# Patient Record
Sex: Female | Born: 1960 | Race: Black or African American | Hispanic: No | Marital: Married | Smoking: Never smoker
Health system: Southern US, Community
[De-identification: ages and names within clinical notes are randomized; demographics above are authoritative.]

## PROBLEM LIST (undated history)

## (undated) DIAGNOSIS — Z9889 Other specified postprocedural states: Secondary | ICD-10-CM

## (undated) DIAGNOSIS — D219 Benign neoplasm of connective and other soft tissue, unspecified: Secondary | ICD-10-CM

## (undated) DIAGNOSIS — D649 Anemia, unspecified: Secondary | ICD-10-CM

## (undated) DIAGNOSIS — C801 Malignant (primary) neoplasm, unspecified: Secondary | ICD-10-CM

## (undated) DIAGNOSIS — R112 Nausea with vomiting, unspecified: Secondary | ICD-10-CM

## (undated) HISTORY — PX: UTERINE FIBROID EMBOLIZATION: SHX825

## (undated) HISTORY — PX: MYOMECTOMY VAGINAL APPROACH: SUR871

## (undated) HISTORY — DX: Anemia, unspecified: D64.9

## (undated) HISTORY — DX: Malignant (primary) neoplasm, unspecified: C80.1

---

## 1997-10-14 DIAGNOSIS — D219 Benign neoplasm of connective and other soft tissue, unspecified: Secondary | ICD-10-CM

## 1997-10-14 HISTORY — DX: Benign neoplasm of connective and other soft tissue, unspecified: D21.9

## 2003-08-18 ENCOUNTER — Other Ambulatory Visit: Admission: RE | Admit: 2003-08-18 | Discharge: 2003-08-18 | Payer: Self-pay | Admitting: Obstetrics and Gynecology

## 2004-12-18 ENCOUNTER — Other Ambulatory Visit: Admission: RE | Admit: 2004-12-18 | Discharge: 2004-12-18 | Payer: Self-pay | Admitting: Obstetrics and Gynecology

## 2006-01-20 ENCOUNTER — Other Ambulatory Visit: Admission: RE | Admit: 2006-01-20 | Discharge: 2006-01-20 | Payer: Self-pay | Admitting: Obstetrics and Gynecology

## 2008-09-27 ENCOUNTER — Encounter: Admission: RE | Admit: 2008-09-27 | Discharge: 2008-10-12 | Payer: Self-pay | Admitting: Family Medicine

## 2008-10-10 ENCOUNTER — Encounter: Admission: RE | Admit: 2008-10-10 | Discharge: 2008-10-10 | Payer: Self-pay | Admitting: Family Medicine

## 2008-10-17 ENCOUNTER — Encounter: Admission: RE | Admit: 2008-10-17 | Discharge: 2008-11-03 | Payer: Self-pay | Admitting: Family Medicine

## 2010-09-18 ENCOUNTER — Encounter: Admission: RE | Admit: 2010-09-18 | Discharge: 2010-09-18 | Payer: Self-pay | Admitting: Obstetrics and Gynecology

## 2010-11-04 ENCOUNTER — Encounter: Payer: Self-pay | Admitting: Obstetrics and Gynecology

## 2010-11-11 ENCOUNTER — Encounter
Admission: RE | Admit: 2010-11-11 | Discharge: 2010-11-11 | Payer: Self-pay | Source: Home / Self Care | Attending: Obstetrics and Gynecology | Admitting: Obstetrics and Gynecology

## 2010-11-12 ENCOUNTER — Other Ambulatory Visit (HOSPITAL_COMMUNITY): Payer: Self-pay | Admitting: Interventional Radiology

## 2010-11-12 DIAGNOSIS — D219 Benign neoplasm of connective and other soft tissue, unspecified: Secondary | ICD-10-CM

## 2011-01-07 ENCOUNTER — Other Ambulatory Visit (HOSPITAL_COMMUNITY): Admit: 2011-01-07 | Payer: Self-pay | Admitting: Interventional Radiology

## 2011-01-07 ENCOUNTER — Ambulatory Visit (HOSPITAL_COMMUNITY): Payer: BC Managed Care – PPO

## 2011-01-07 ENCOUNTER — Ambulatory Visit (HOSPITAL_COMMUNITY): Admission: RE | Admit: 2011-01-07 | Payer: Self-pay | Source: Ambulatory Visit | Admitting: Interventional Radiology

## 2011-01-07 ENCOUNTER — Ambulatory Visit (HOSPITAL_COMMUNITY)
Admission: RE | Admit: 2011-01-07 | Discharge: 2011-01-07 | Disposition: A | Discharge status: Home / Self Care | Payer: BC Managed Care – PPO | Source: Ambulatory Visit | Attending: Interventional Radiology | Admitting: Interventional Radiology

## 2011-01-07 ENCOUNTER — Other Ambulatory Visit (HOSPITAL_COMMUNITY)
Admission: RE | Admit: 2011-01-07 | Payer: BC Managed Care – PPO | Source: Ambulatory Visit | Admitting: Interventional Radiology

## 2011-01-07 ENCOUNTER — Inpatient Hospital Stay (HOSPITAL_COMMUNITY): Admit: 2011-01-07 | Payer: Self-pay

## 2011-01-07 DIAGNOSIS — D259 Leiomyoma of uterus, unspecified: Secondary | ICD-10-CM | POA: Insufficient documentation

## 2011-01-07 LAB — CBC
HCT: 32 % — ABNORMAL LOW (ref 36.0–46.0)
Hemoglobin: 10 g/dL — ABNORMAL LOW (ref 12.0–15.0)
WBC: 5.6 10*3/uL (ref 4.0–10.5)

## 2011-01-07 LAB — CREATININE, SERUM: GFR calc non Af Amer: >60 mL/min (ref >60)

## 2011-01-09 ENCOUNTER — Other Ambulatory Visit (HOSPITAL_COMMUNITY): Payer: Self-pay

## 2011-01-09 ENCOUNTER — Ambulatory Visit (HOSPITAL_COMMUNITY)
Admission: RE | Admit: 2011-01-09 | Discharge: 2011-01-10 | Disposition: A | Discharge status: Home / Self Care | Payer: BC Managed Care – PPO | Source: Ambulatory Visit | Attending: Interventional Radiology | Admitting: Interventional Radiology

## 2011-01-09 ENCOUNTER — Other Ambulatory Visit (HOSPITAL_COMMUNITY): Payer: Self-pay | Admitting: Interventional Radiology

## 2011-01-09 DIAGNOSIS — D259 Leiomyoma of uterus, unspecified: Secondary | ICD-10-CM | POA: Insufficient documentation

## 2011-01-09 DIAGNOSIS — N920 Excessive and frequent menstruation with regular cycle: Secondary | ICD-10-CM | POA: Insufficient documentation

## 2011-01-09 DIAGNOSIS — D219 Benign neoplasm of connective and other soft tissue, unspecified: Secondary | ICD-10-CM

## 2011-01-09 DIAGNOSIS — R350 Frequency of micturition: Secondary | ICD-10-CM | POA: Insufficient documentation

## 2011-01-09 DIAGNOSIS — D649 Anemia, unspecified: Secondary | ICD-10-CM | POA: Insufficient documentation

## 2011-01-09 MED ORDER — IOHEXOL 300 MG/ML  SOLN: 100.0000 mL | Freq: Once | INTRAMUSCULAR | Status: AC | PRN

## 2011-01-09 MED ORDER — IOHEXOL 300 MG/ML  SOLN: 140.0000 mL | Freq: Once | INTRAMUSCULAR | Status: AC | PRN

## 2011-02-05 ENCOUNTER — Ambulatory Visit
Admit: 2011-02-05 | Discharge: 2011-02-05 | Disposition: A | Discharge status: Home / Self Care | Payer: BC Managed Care – PPO | Attending: Interventional Radiology | Admitting: Interventional Radiology

## 2011-02-05 HISTORY — DX: Benign neoplasm of connective and other soft tissue, unspecified: D21.9

## 2011-02-05 NOTE — Progress Notes (Signed)
Pt states that she has not had menstrual cycle post [place].  Spotting has resolved.  Denies pain. Afebrile.  Pt returned to work on 01-28-2011.    Pt states that bulk Sx are improving.

## 2011-07-01 ENCOUNTER — Other Ambulatory Visit: Payer: Self-pay | Admitting: Interventional Radiology

## 2011-07-01 ENCOUNTER — Other Ambulatory Visit: Payer: Self-pay | Admitting: Obstetrics and Gynecology

## 2011-07-01 DIAGNOSIS — D219 Benign neoplasm of connective and other soft tissue, unspecified: Secondary | ICD-10-CM

## 2011-07-27 ENCOUNTER — Other Ambulatory Visit: Payer: BC Managed Care – PPO

## 2011-07-31 ENCOUNTER — Other Ambulatory Visit: Payer: BC Managed Care – PPO

## 2012-11-25 ENCOUNTER — Telehealth: Payer: Self-pay | Admitting: Oncology

## 2012-11-25 NOTE — Telephone Encounter (Signed)
LVOM for pt to return call.  °

## 2012-12-02 ENCOUNTER — Telehealth: Payer: Self-pay | Admitting: Oncology

## 2012-12-02 NOTE — Telephone Encounter (Signed)
S/W pt in re NP appt 03/11 @ 1:30 w/Dr. Clelia Croft.  Referring Dr. Vincente Poli Dx- Severe Anemia Welcome packet mailed.

## 2012-12-10 ENCOUNTER — Telehealth: Payer: Self-pay | Admitting: Oncology

## 2012-12-10 NOTE — Telephone Encounter (Signed)
C/D 12/10/12 for appt. 12/22/12

## 2012-12-18 ENCOUNTER — Other Ambulatory Visit: Payer: Self-pay | Admitting: Oncology

## 2012-12-22 ENCOUNTER — Encounter: Payer: Self-pay | Admitting: Oncology

## 2012-12-22 ENCOUNTER — Telehealth: Payer: Self-pay | Admitting: Oncology

## 2012-12-22 ENCOUNTER — Other Ambulatory Visit (HOSPITAL_BASED_OUTPATIENT_CLINIC_OR_DEPARTMENT_OTHER): Payer: BC Managed Care – PPO | Admitting: Lab

## 2012-12-22 ENCOUNTER — Ambulatory Visit (HOSPITAL_BASED_OUTPATIENT_CLINIC_OR_DEPARTMENT_OTHER): Payer: BC Managed Care – PPO | Admitting: Oncology

## 2012-12-22 ENCOUNTER — Ambulatory Visit: Payer: BC Managed Care – PPO

## 2012-12-22 VITALS — BP 128/73 | HR 67 | Temp 97.9°F | Resp 18 | Ht 67.5 in | Wt 147.7 lb

## 2012-12-22 DIAGNOSIS — D649 Anemia, unspecified: Secondary | ICD-10-CM

## 2012-12-22 HISTORY — DX: Anemia, unspecified: D64.9

## 2012-12-22 LAB — CHCC SMEAR

## 2012-12-22 LAB — COMPREHENSIVE METABOLIC PANEL (CC13)
CO2: 26 mEq/L (ref 22–29)
Creatinine: 0.7 mg/dL (ref 0.6–1.1)
Glucose: 77 mg/dl (ref 70–99)
Sodium: 138 mEq/L (ref 136–145)
Total Bilirubin: 0.4 mg/dL (ref 0.20–1.20)
Total Protein: 7.1 g/dL (ref 6.4–8.3)

## 2012-12-22 LAB — FERRITIN: Ferritin: 4 ng/mL — ABNORMAL LOW (ref 10–291)

## 2012-12-22 LAB — CBC WITH DIFFERENTIAL/PLATELET
Eosinophils Absolute: 0.1 10*3/uL (ref 0.0–0.5)
HCT: 29.1 % — ABNORMAL LOW (ref 34.8–46.6)
LYMPH%: 28.3 % (ref 14.0–49.7)
MONO#: 0.9 10*3/uL (ref 0.1–0.9)
NEUT#: 3.2 10*3/uL (ref 1.5–6.5)
NEUT%: 54.6 % (ref 38.4–76.8)
Platelets: 237 10*3/uL (ref 145–400)
WBC: 5.8 10*3/uL (ref 3.9–10.3)

## 2012-12-22 LAB — IRON AND TIBC: TIBC: 468 ug/dL (ref 250–470)

## 2012-12-22 NOTE — Progress Notes (Signed)
CC:   Erica Yu, M.D.  REASON FOR CONSULTATION:  Anemia.  HISTORY OF PRESENT ILLNESS:  Erica Yu is a pleasant 52 year old woman currently of [place].  She has a past medical history significant for menorrhagia due to uterine fibroids.  She has been diagnosed with this for the last 14 or 15 years and has had a few interventions to this point without any major success.  She had a previous myomectomy in 2001. She had also had uterine artery embolization under the care of Dr. Fredia Sorrow in 2012.  Despite all this intervention she still has a heavy menstrual bleeding, large blood clots noted and continued to struggle with anemia.  She has had iron supplements in the past in different formulation that have caused her to have abdominal pain and constipation so only takes multivitamins.  She also has had problems with abdominal discomfort and cramping due to the fibroids.  She has had discussions with Dr. Vincente Yu regarding her anemia and fibroids and had recommended a hysterectomy.  She had a hemoglobin checked through a fingerstick on 11/24/2012 and her hemoglobin was 6.5.  For that reason the patient was referred to me for evaluation as she needs to improve her hemoglobin before consideration of a hysterectomy.  Clinically Erica Yu is really minimally symptomatic.  She does report some slight fatigue in the afternoon but still works full time and unable to do so without any major problems.  She had not had any hematochezia, had not had any melena.  She does report blood in the urine although it could be from a more vaginal source.  She had not reported any chest pain, had not reported any difficulty breathing.  As mentioned, performs activities of daily living without any hindrance or decline.  REVIEW OF SYSTEMS:  Not reporting any headaches, blurry vision or double vision.  Not reporting any motor or sensory neuropathy.  Not reporting any alteration in mental status.  Not  reporting any psychiatric issues, depression.  Not reporting any fever, chills, sweats.  Not reporting cough, hemoptysis, hematemesis.  No nausea, vomiting.  Not reporting any abdominal pain.  No hematochezia, melena, genitourinary complaints.  The rest of the review of systems unremarkable.  PAST MEDICAL HISTORY:  She denied any history of hypertension, diabetes, coronary artery disease.  No asthma or allergies.  She is status post history of uterine artery embolization.  Also history of myomectomy.  MEDICATIONS:  Multivitamins only.  ALLERGIES:  None.  SOCIAL HISTORY:  She is married.  She lives in [place].  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  She denied any alcohol or tobacco abuse.  FAMILY HISTORY:  History of hypertension and diabetes.  No history of any anemia or sickle cell or any other hemoglobinopathy.  PHYSICAL EXAMINATION:  General:  Alert, awake, pleasant woman, appeared in no active distress.  Vital signs:  Her blood pressure is 120/73, pulse 67, respiration 18, temperature 97.9, weighs 147 pounds.  BSA of 1.79 sq m.  ECOG performance status is 0.  HEENT:  Head is normocephalic, atraumatic.  Pupils equal, round, reactive to light. Oral mucosa moist and pink.  Neck:  Supple.  No lymphadenopathy.  Heart: Regular rate and rhythm.  S1, S2.  Lungs:  Clear to auscultation.  No crackle or wheeze or dullness to percussion.  Abdomen:  Soft, nontender. No hepatosplenomegaly.  Extremities:  No clubbing, cyanosis or edema. Neurologic:  Intact motor and sensory and deep tendon reflexes.  LABORATORY DATA:  Today  showed a hemoglobin of 9.2, white cell count of 5.8, platelet count of 237.  Her MCV is 69.  Her RDW is 26.1. Peripheral smear showed clear evidence of hypochromia and microcytosis, could not appreciate any target cells today.  ASSESSMENT AND PLAN:  A 52 year old woman with the following issues: 1. Microcytic hypochromic anemia.  The differential  diagnosis would     include most likely iron deficiency, other etiology would sickle     cell anemia or hemoglobinopathy.  The iron replacement strategies     were discussed today with Erica Yu.  That would include more     intensive p.o. iron which she has had a lot of problems with     including GI distress.  I have also talked to her in detail about     IV iron supplementation.  Different IV iron formulation were     discussed including Feraheme, Venofer and iron dextran.  I still     believe that Feraheme would be the most efficient way of     administering iron at this point.  Risks and benefits were     discussed.  Complications that include infusion related toxicities,     arthralgias, myalgias and she is willing to proceed.  I told her     that there is always a risk of severe reaction, anaphylaxis but     that would be anticipated to be less likely at this time.  For that     reason she will have a small period of observation after the IV     iron infusion to monitor for infusion related reaction.  I     anticipate to do that by the end of this week and anticipate     improvement in hemoglobin in the next 2 weeks which we will recheck     that by the end of this month. 2. Menorrhagia due to uterine fibroids.  She has had discussions with     Dr. Vincente Yu regarding a hysterectomy.  I think with a hemoglobin of     9 which is bound to improve with the IV iron I think she should be     able to have a hysterectomy successfully any time Dr. Vincente Yu and     Erica Yu feel it is appropriate.  I think with her IV iron     infusion by the end of this week she will be able to handle this     operation without any complication from a hematology standpoint. All her questions were answered today.  I will have a quick followup in 2 weeks to make sure hemoglobin is trending in the right direction and then a followup probably in 2-3 months after that to recheck her  iron stores.    ______________________________ Benjiman Core, M.D. FNS/MEDQ  D:  12/22/2012  T:  12/22/2012  Job:  [id number]

## 2012-12-22 NOTE — Telephone Encounter (Signed)
gv and printed appt schedule to pt for March...printed tx paper for Melissa.Marland Kitchen

## 2012-12-22 NOTE — Progress Notes (Signed)
Note dictated

## 2012-12-22 NOTE — Progress Notes (Signed)
Checked in new pt with no financial concerns. °

## 2012-12-23 ENCOUNTER — Telehealth: Payer: Self-pay | Admitting: Oncology

## 2012-12-23 NOTE — Telephone Encounter (Signed)
lvm for pt regarding to 3.13.14 appt

## 2012-12-24 ENCOUNTER — Ambulatory Visit: Payer: BC Managed Care – PPO

## 2012-12-24 ENCOUNTER — Telehealth: Payer: Self-pay | Admitting: Oncology

## 2012-12-24 NOTE — Telephone Encounter (Signed)
returned pt call on the [phone] phone line as requested...pt stated that she did not know when her appt was when i left her a msg on 3.12.14...advised pt to call back

## 2012-12-29 ENCOUNTER — Ambulatory Visit (HOSPITAL_BASED_OUTPATIENT_CLINIC_OR_DEPARTMENT_OTHER): Payer: BC Managed Care – PPO

## 2012-12-29 ENCOUNTER — Other Ambulatory Visit: Payer: Self-pay | Admitting: *Deleted

## 2012-12-29 VITALS — BP 128/74 | HR 63 | Temp 96.7°F

## 2012-12-29 DIAGNOSIS — D509 Iron deficiency anemia, unspecified: Secondary | ICD-10-CM

## 2012-12-29 MED ORDER — SODIUM CHLORIDE 0.9 % IV SOLN
Freq: Once | INTRAVENOUS | Status: AC
  Administered 2012-12-29: 10:00:00 via INTRAVENOUS

## 2012-12-29 MED ORDER — SODIUM CHLORIDE 0.9 % IV SOLN
1020.0000 mg | Freq: Once | INTRAVENOUS | Status: AC
  Administered 2012-12-29: 1020 mg via INTRAVENOUS
  Filled 2012-12-29: qty 34

## 2012-12-29 NOTE — Patient Instructions (Addendum)
Ferumoxytol injection What is this medicine? FERUMOXYTOL is an iron complex. Iron is used to make healthy red blood cells, which carry oxygen and nutrients throughout the body. This medicine is used to treat iron deficiency anemia in people with chronic kidney disease. This medicine may be used for other purposes; ask your health care provider or pharmacist if you have questions. What should I tell my health care provider before I take this medicine? They need to know if you have any of these conditions: -anemia not caused by low iron levels -high levels of iron in the blood -magnetic resonance imaging (MRI) test scheduled -an unusual or allergic reaction to iron, other medicines, foods, dyes, or preservatives -pregnant or trying to get pregnant -breast-feeding How should I use this medicine? This medicine is for infusion into a vein. It is given by a health care professional in a hospital or clinic setting. Talk to your pediatrician regarding the use of this medicine in children. Special care may be needed. Overdosage: If you think you've taken too much of this medicine contact a poison control center or emergency room at once. Overdosage: If you think you have taken too much of this medicine contact a poison control center or emergency room at once. NOTE: This medicine is only for you. Do not share this medicine with others. What if I miss a dose? It is important not to miss your dose. Call your doctor or health care professional if you are unable to keep an appointment. What may interact with this medicine? This medicine may interact with the following medications: -other iron products This list may not describe all possible interactions. Give your health care provider a list of all the medicines, herbs, non-prescription drugs, or dietary supplements you use. Also tell them if you smoke, drink alcohol, or use illegal drugs. Some items may interact with your medicine. What should I watch  for while using this medicine? Visit your doctor or healthcare professional regularly. Tell your doctor or healthcare professional if your symptoms do not start to get better or if they get worse. You may need blood work done while you are taking this medicine. You may need to follow a special diet. Talk to your doctor. Foods that contain iron include: whole grains/cereals, dried fruits, beans, or peas, leafy green vegetables, and organ meats (liver, kidney). What side effects may I notice from receiving this medicine? Side effects that you should report to your doctor or health care professional as soon as possible: -allergic reactions like skin rash, itching or hives, swelling of the face, lips, or tongue -breathing problems -changes in blood pressure -feeling faint or lightheaded, falls -fever or chills -flushing, sweating, or hot feelings -swelling of the ankles or feet Side effects that usually do not require medical attention (Report these to your doctor or health care professional if they continue or are bothersome.): -diarrhea -headache -nausea, vomiting -stomach pain This list may not describe all possible side effects. Call your doctor for medical advice about side effects. You may report side effects to FDA at 1-800-FDA-1088. Where should I keep my medicine? This drug is given in a hospital or clinic and will not be stored at home. NOTE: This sheet is a summary. It may not cover all possible information. If you have questions about this medicine, talk to your doctor, pharmacist, or health care provider.  2012, Elsevier/Gold Standard. (06/22/2008 9:48:25 PM) 

## 2013-01-08 ENCOUNTER — Encounter: Payer: Self-pay | Admitting: Oncology

## 2013-01-08 ENCOUNTER — Telehealth: Payer: Self-pay | Admitting: Oncology

## 2013-01-08 ENCOUNTER — Encounter: Payer: BC Managed Care – PPO | Admitting: Oncology

## 2013-01-08 ENCOUNTER — Other Ambulatory Visit (HOSPITAL_BASED_OUTPATIENT_CLINIC_OR_DEPARTMENT_OTHER): Payer: BC Managed Care – PPO | Admitting: Lab

## 2013-01-08 DIAGNOSIS — D649 Anemia, unspecified: Secondary | ICD-10-CM

## 2013-01-08 LAB — CBC WITH DIFFERENTIAL/PLATELET
Basophils Absolute: 0 10*3/uL (ref 0.0–0.1)
EOS%: 1.6 % (ref 0.0–7.0)
HGB: 11.2 g/dL — ABNORMAL LOW (ref 11.6–15.9)
LYMPH%: 17.8 % (ref 14.0–49.7)
MCH: 24.3 pg — ABNORMAL LOW (ref 25.1–34.0)
MCV: 76.1 fL — ABNORMAL LOW (ref 79.5–101.0)
MONO%: 10.7 % (ref 0.0–14.0)
NEUT%: 69.8 % (ref 38.4–76.8)
Platelets: 257 10*3/uL (ref 145–400)
RDW: 26.9 % — ABNORMAL HIGH (ref 11.2–14.5)

## 2013-01-08 NOTE — Telephone Encounter (Signed)
gv and printed appt schedule for pt for June per pt reques

## 2013-01-08 NOTE — Progress Notes (Signed)
This encounter was created in error - please disregard.

## 2013-01-08 NOTE — Addendum Note (Signed)
Addended by: Myrtis Ser on: 01/08/2013 02:10 PM   Modules accepted: Orders, Level of Service

## 2013-01-08 NOTE — Progress Notes (Signed)
Hematology and Oncology Follow Up Visit  Erica Yu [id number] [birth year] 52 y.o. 01/08/2013 2:05 PM Erica Yu, MDGrewal, Erica Yu   Principle Diagnosis: Anemia due to iron deficiency.  Prior Therapy: Feraheme 1,020 mg IV on 01/04/13  Current therapy: Observation  Interim History:  Erica Yu returns for routine follow-up by herself. Received IV iron last week without infusion reaction. No arthralgias or myalgias. Has a decreased craving for ice. No CP, SOB, DOE. She has not had any bleeding since her last visit with [place].   Medications: I have reviewed the patient's current medications. Current outpatient prescriptions:Multiple Vitamin (MULTIVITAMIN) tablet, Take 1 tablet by mouth daily., Disp: , Rfl:   Allergies:  Allergies  Allergen Reactions  . Penicillins     Past Medical History, Surgical history, Social history, and Family History were reviewed and updated.  Review of Systems: Constitutional:  Negative for fever, chills, night sweats, anorexia, weight loss, pain. Cardiovascular: no chest pain or dyspnea on exertion Respiratory: no cough, shortness of breath, or wheezing Neurological: no TIA or stroke symptoms Dermatological: negative ENT: negative Skin: Negative. Gastrointestinal: no abdominal pain, change in bowel habits, or black or bloody stools Genito-Urinary: no dysuria, trouble voiding, or hematuria Hematological and Lymphatic: negative Breast: negative for breast lumps Musculoskeletal: negative Remaining ROS negative.  Physical Exam: Blood pressure 111/70, pulse 67, temperature 98.4 F (36.9 C), temperature source Oral, resp. rate 20, height 5' 7.5" (1.715 m), weight 147 lb 6.4 oz (66.86 kg). ECOG: 0 General appearance: alert, cooperative and no distress Head: Normocephalic, without obvious abnormality, atraumatic Neck: no adenopathy, no carotid bruit, no JVD, supple, symmetrical, trachea midline and thyroid not enlarged, symmetric, no  tenderness/mass/nodules Lymph nodes: Cervical, supraclavicular, and axillary nodes normal. Heart:regular rate and rhythm, S1, S2 normal, no murmur, click, rub or gallop Lung:chest clear, no wheezing, rales, normal symmetric air entry, no tachypnea, retractions or cyanosis Abdomen: soft, non-tender, without masses or organomegaly EXT:no erythema, induration, or nodules   Lab Results: Lab Results  Component Value Date   WBC 8.6 01/08/2013   HGB 11.2* 01/08/2013   HCT 35.1 01/08/2013   MCV 76.1* 01/08/2013   PLT 257 01/08/2013     Chemistry      Component Value Date/Time   NA 138 12/22/2012 1343   K 3.9 12/22/2012 1343   CL 106 12/22/2012 1343   CO2 26 12/22/2012 1343   BUN 10.7 12/22/2012 1343   CREATININE 0.7 12/22/2012 1343   CREATININE 0.76 01/07/2011 1403      Component Value Date/Time   CALCIUM 9.2 12/22/2012 1343   ALKPHOS 55 12/22/2012 1343   AST 15 12/22/2012 1343   ALT 13 12/22/2012 1343   BILITOT 0.40 12/22/2012 1343      Impression and Plan: This is a 52 year old female with the following issues: 1. Iron deficiency anemia. S/P IV iron with improvement in Hgb to 11.2. Will plan to continue to watch h er CBC periodically and give more IV iron if ferritin drops.  2. Uterine fibroids. Patient will f/u with Dr Vincente Poli to discuss possible hysterectomy. The patient is stable from a hematology standpoint to proceed with this. 3. Follow-up. In 2-3 months.  Spent more than half the time coordinating care.    Clenton Pare 3/28/20142:05 PM

## 2013-01-19 ENCOUNTER — Encounter (HOSPITAL_COMMUNITY): Payer: Self-pay | Admitting: Pharmacy Technician

## 2013-01-27 ENCOUNTER — Other Ambulatory Visit (HOSPITAL_COMMUNITY): Payer: BC Managed Care – PPO

## 2013-01-27 ENCOUNTER — Encounter (HOSPITAL_COMMUNITY)
Admission: RE | Admit: 2013-01-27 | Discharge: 2013-01-27 | Disposition: A | Discharge status: Home / Self Care | Payer: BC Managed Care – PPO | Source: Ambulatory Visit | Attending: Obstetrics and Gynecology | Admitting: Obstetrics and Gynecology

## 2013-01-27 ENCOUNTER — Encounter (HOSPITAL_COMMUNITY): Payer: Self-pay

## 2013-01-27 ENCOUNTER — Inpatient Hospital Stay (HOSPITAL_COMMUNITY)
Admission: RE | Admit: 2013-01-27 | Discharge: 2013-01-27 | Disposition: A | Discharge status: Home / Self Care | Payer: BC Managed Care – PPO | Source: Ambulatory Visit

## 2013-01-27 DIAGNOSIS — Z01812 Encounter for preprocedural laboratory examination: Secondary | ICD-10-CM | POA: Insufficient documentation

## 2013-01-27 DIAGNOSIS — Z01818 Encounter for other preprocedural examination: Secondary | ICD-10-CM | POA: Insufficient documentation

## 2013-01-27 HISTORY — DX: Other specified postprocedural states: R11.2

## 2013-01-27 HISTORY — DX: Nausea with vomiting, unspecified: Z98.890

## 2013-01-27 LAB — CBC
Hemoglobin: 12.1 g/dL (ref 12.0–15.0)
MCH: 26.2 pg (ref 26.0–34.0)
MCHC: 33.5 g/dL (ref 30.0–36.0)
Platelets: 227 10*3/uL (ref 150–400)
RDW: 26.2 % — ABNORMAL HIGH (ref 11.5–15.5)

## 2013-01-27 LAB — SURGICAL PCR SCREEN: MRSA, PCR: NEGATIVE

## 2013-01-27 NOTE — Patient Instructions (Addendum)
   Your procedure is scheduled on: Thursday April 24 Enter through the [employer] of [facility] at: [employer] up the phone at the desk and dial [phone] and inform us of your arrival.  Please call this number if you have any problems the morning of surgery: [phone]  Remember: Do not eat any solid foods or drink any liquids after midnight on: Wednesday Please take these medications morning of surgery:  Do not wear jewelry, make-up, or FINGER nail polish No metal in your hair or on your body. Do not wear lotions, powders, perfumes. You may wear deodorant.  Please use your CHG wash as directed prior to surgery.  Do not shave anywhere for at least 12 hours prior to first CHG shower.  Do not bring valuables to the hospital. Contacts, Dentures and Partial Plates may not be worn to OR  Leave suitcase in the car. After Surgery it may be brought to your room.  For patients being admitted to the hospital, checkout time is 11:00am the day of discharge.

## 2013-01-27 NOTE — Patient Instructions (Addendum)
   Your procedure is scheduled on: Thursday, April 24th Enter through the Main Entrance of [facility] at: 6 am  Pick up the phone at the desk and dial [phone] and inform us of your arrival.  Please call this number if you have any problems the morning of surgery: [phone]  Remember: Do not eat any solid foods or drink any liquids after midnight on: Wednesday the 23rd  Do not wear jewelry, make-up, or FINGER nail polish No metal in your hair or on your body. Do not wear lotions, powders, perfumes. You may wear deodorant.  Please use your CHG wash as directed prior to surgery.  Do not shave anywhere for at least 12 hours prior to first CHG shower.  Do not bring valuables to the hospital. Contacts, Dentures and Partial Plates may not be worn to OR  Leave suitcase in the car. After Surgery it may be brought to your room.  For patients being admitted to the hospital, checkout time is 11:00am the day of discharge.

## 2013-01-28 ENCOUNTER — Other Ambulatory Visit (HOSPITAL_COMMUNITY): Payer: BC Managed Care – PPO

## 2013-02-03 MED ORDER — GENTAMICIN SULFATE 40 MG/ML IJ SOLN
INTRAVENOUS | Status: AC
  Administered 2013-02-04: 100 mL via INTRAVENOUS
  Filled 2013-02-03: qty 8

## 2013-02-04 ENCOUNTER — Encounter (HOSPITAL_COMMUNITY): Payer: Self-pay | Admitting: Anesthesiology

## 2013-02-04 ENCOUNTER — Inpatient Hospital Stay (HOSPITAL_COMMUNITY)
Admission: RE | Admit: 2013-02-04 | Discharge: 2013-02-06 | DRG: 359 | Disposition: A | Discharge status: Home / Self Care | Payer: BC Managed Care – PPO | Source: Ambulatory Visit | Attending: Obstetrics and Gynecology | Admitting: Obstetrics and Gynecology

## 2013-02-04 ENCOUNTER — Inpatient Hospital Stay (HOSPITAL_COMMUNITY): Payer: BC Managed Care – PPO | Admitting: Anesthesiology

## 2013-02-04 ENCOUNTER — Encounter (HOSPITAL_COMMUNITY)
Admission: RE | Disposition: A | Discharge status: Home / Self Care | Payer: Self-pay | Source: Ambulatory Visit | Attending: Obstetrics and Gynecology

## 2013-02-04 DIAGNOSIS — D649 Anemia, unspecified: Secondary | ICD-10-CM

## 2013-02-04 DIAGNOSIS — M795 Residual foreign body in soft tissue: Secondary | ICD-10-CM | POA: Diagnosis present

## 2013-02-04 DIAGNOSIS — D251 Intramural leiomyoma of uterus: Principal | ICD-10-CM | POA: Diagnosis present

## 2013-02-04 DIAGNOSIS — D509 Iron deficiency anemia, unspecified: Secondary | ICD-10-CM | POA: Diagnosis present

## 2013-02-04 DIAGNOSIS — D219 Benign neoplasm of connective and other soft tissue, unspecified: Secondary | ICD-10-CM

## 2013-02-04 DIAGNOSIS — C562 Malignant neoplasm of left ovary: Secondary | ICD-10-CM

## 2013-02-04 DIAGNOSIS — N736 Female pelvic peritoneal adhesions (postinfective): Secondary | ICD-10-CM | POA: Diagnosis present

## 2013-02-04 DIAGNOSIS — N920 Excessive and frequent menstruation with regular cycle: Secondary | ICD-10-CM | POA: Diagnosis present

## 2013-02-04 DIAGNOSIS — C574 Malignant neoplasm of uterine adnexa, unspecified: Secondary | ICD-10-CM | POA: Diagnosis present

## 2013-02-04 HISTORY — PX: SUPRACERVICAL ABDOMINAL HYSTERECTOMY: SHX5393

## 2013-02-04 HISTORY — PX: FOREIGN BODY REMOVAL: SHX962

## 2013-02-04 HISTORY — PX: LAPAROSCOPIC LYSIS OF ADHESIONS: SHX5905

## 2013-02-04 HISTORY — PX: CYSTOSCOPY: SHX5120

## 2013-02-04 LAB — CBC
HCT: 35.1 % — ABNORMAL LOW (ref 36.0–46.0)
HCT: 35.3 % — ABNORMAL LOW (ref 36.0–46.0)
Hemoglobin: 11.9 g/dL — ABNORMAL LOW (ref 12.0–15.0)
MCH: 27.2 pg (ref 26.0–34.0)
MCH: 27.3 pg (ref 26.0–34.0)
MCHC: 34 g/dL (ref 30.0–36.0)
MCV: 80.1 fL (ref 78.0–100.0)
MCV: 80.4 fL (ref 78.0–100.0)
Platelets: 208 10*3/uL (ref 150–400)
RBC: 4.38 MIL/uL (ref 3.87–5.11)
RDW: 24.9 % — ABNORMAL HIGH (ref 11.5–15.5)

## 2013-02-04 LAB — BASIC METABOLIC PANEL
CO2: 26 mEq/L (ref 19–32)
Calcium: 8.5 mg/dL (ref 8.4–10.5)
Chloride: 103 mEq/L (ref 96–112)
Glucose, Bld: 164 mg/dL — ABNORMAL HIGH (ref 70–99)
Potassium: 3.9 mEq/L (ref 3.5–5.1)
Sodium: 135 mEq/L (ref 135–145)

## 2013-02-04 LAB — PREGNANCY, URINE: Preg Test, Ur: NEGATIVE

## 2013-02-04 SURGERY — HYSTERECTOMY, SUPRACERVICAL, ABDOMINAL
Anesthesia: General | Site: Urethra | Wound class: Clean Contaminated

## 2013-02-04 MED ORDER — NEOSTIGMINE METHYLSULFATE 1 MG/ML IJ SOLN
INTRAMUSCULAR | Status: AC
Start: 2013-02-04 — End: 2013-02-04
  Filled 2013-02-04: qty 1

## 2013-02-04 MED ORDER — GLYCOPYRROLATE 0.2 MG/ML IJ SOLN
INTRAMUSCULAR | Status: AC
Start: 2013-02-04 — End: 2013-02-04
  Filled 2013-02-04: qty 1

## 2013-02-04 MED ORDER — ONDANSETRON HCL 4 MG/2ML IJ SOLN
INTRAMUSCULAR | Status: AC
  Filled 2013-02-04: qty 2

## 2013-02-04 MED ORDER — KETOROLAC TROMETHAMINE 30 MG/ML IJ SOLN: 30.0000 mg | Freq: Once | INTRAMUSCULAR | Status: AC

## 2013-02-04 MED ORDER — MENTHOL 3 MG MT LOZG: 1.0000 | LOZENGE | OROMUCOSAL | Status: DC | PRN

## 2013-02-04 MED ORDER — ONDANSETRON HCL 4 MG/2ML IJ SOLN: 4.0000 mg | Freq: Four times a day (QID) | INTRAMUSCULAR | Status: DC | PRN

## 2013-02-04 MED ORDER — LACTATED RINGERS IV SOLN
INTRAVENOUS | Status: DC
  Administered 2013-02-04 – 2013-02-05 (×3): via INTRAVENOUS

## 2013-02-04 MED ORDER — KETOROLAC TROMETHAMINE 30 MG/ML IJ SOLN
INTRAMUSCULAR | Status: AC
  Administered 2013-02-04: 30 mg via INTRAVENOUS
  Filled 2013-02-04: qty 1

## 2013-02-04 MED ORDER — LIDOCAINE HCL (CARDIAC) 20 MG/ML IV SOLN
INTRAVENOUS | Status: DC | PRN
  Administered 2013-02-04: 50 mg via INTRAVENOUS

## 2013-02-04 MED ORDER — ENOXAPARIN SODIUM 40 MG/0.4ML ~~LOC~~ SOLN
40.0000 mg | SUBCUTANEOUS | Status: DC
  Filled 2013-02-04: qty 0.4

## 2013-02-04 MED ORDER — SODIUM CHLORIDE 0.9 % IJ SOLN: 9.0000 mL | INTRAMUSCULAR | Status: DC | PRN

## 2013-02-04 MED ORDER — STERILE WATER FOR IRRIGATION IR SOLN
Status: DC | PRN
  Administered 2013-02-04: 3000 mL via INTRAVESICAL
  Administered 2013-02-04 (×2): 1000 mL via INTRAVESICAL

## 2013-02-04 MED ORDER — LACTATED RINGERS IV SOLN
INTRAVENOUS | Status: DC
  Administered 2013-02-04 (×3): via INTRAVENOUS

## 2013-02-04 MED ORDER — LACTATED RINGERS IV SOLN: INTRAVENOUS | Status: DC

## 2013-02-04 MED ORDER — HYDROMORPHONE 0.3 MG/ML IV SOLN
INTRAVENOUS | Status: DC
  Administered 2013-02-04: 0.2 mg via INTRAVENOUS
  Administered 2013-02-04: 11:00:00 via INTRAVENOUS
  Administered 2013-02-04: 0.599 mg via INTRAVENOUS
  Administered 2013-02-05: 0.2 mg via INTRAVENOUS
  Filled 2013-02-04: qty 25

## 2013-02-04 MED ORDER — 0.9 % SODIUM CHLORIDE (POUR BTL) OPTIME
TOPICAL | Status: DC | PRN
  Administered 2013-02-04 (×2): 1000 mL

## 2013-02-04 MED ORDER — GLYCOPYRROLATE 0.2 MG/ML IJ SOLN
INTRAMUSCULAR | Status: DC | PRN
  Administered 2013-02-04: 0.6 mg via INTRAVENOUS

## 2013-02-04 MED ORDER — SCOPOLAMINE 1 MG/3DAYS TD PT72
1.0000 | MEDICATED_PATCH | TRANSDERMAL | Status: DC
  Administered 2013-02-04: 1.5 mg via TRANSDERMAL

## 2013-02-04 MED ORDER — PROPOFOL 10 MG/ML IV EMUL
INTRAVENOUS | Status: DC | PRN
  Administered 2013-02-04: 50 mg via INTRAVENOUS

## 2013-02-04 MED ORDER — DIPHENHYDRAMINE HCL 12.5 MG/5ML PO ELIX
12.5000 mg | ORAL_SOLUTION | Freq: Four times a day (QID) | ORAL | Status: DC | PRN
  Filled 2013-02-04: qty 5

## 2013-02-04 MED ORDER — FENTANYL CITRATE 0.05 MG/ML IJ SOLN
INTRAMUSCULAR | Status: AC
  Filled 2013-02-04: qty 5

## 2013-02-04 MED ORDER — BUPIVACAINE HCL (PF) 0.25 % IJ SOLN
INTRAMUSCULAR | Status: AC
  Filled 2013-02-04: qty 30

## 2013-02-04 MED ORDER — NEOSTIGMINE METHYLSULFATE 1 MG/ML IJ SOLN
INTRAMUSCULAR | Status: DC | PRN
  Administered 2013-02-04: 3 mg via INTRAVENOUS

## 2013-02-04 MED ORDER — FENTANYL CITRATE 0.05 MG/ML IJ SOLN
INTRAMUSCULAR | Status: DC | PRN
  Administered 2013-02-04: 100 ug via INTRAVENOUS
  Administered 2013-02-04: 150 ug via INTRAVENOUS

## 2013-02-04 MED ORDER — MIDAZOLAM HCL 5 MG/5ML IJ SOLN
INTRAMUSCULAR | Status: DC | PRN
  Administered 2013-02-04: 2 mg via INTRAVENOUS

## 2013-02-04 MED ORDER — DIPHENHYDRAMINE HCL 50 MG/ML IJ SOLN: 12.5000 mg | Freq: Four times a day (QID) | INTRAMUSCULAR | Status: DC | PRN

## 2013-02-04 MED ORDER — PROPOFOL 10 MG/ML IV EMUL
INTRAVENOUS | Status: AC
  Filled 2013-02-04: qty 20

## 2013-02-04 MED ORDER — SCOPOLAMINE 1 MG/3DAYS TD PT72: 1.0000 | MEDICATED_PATCH | Freq: Once | TRANSDERMAL | Status: DC

## 2013-02-04 MED ORDER — HYDROMORPHONE HCL PF 1 MG/ML IJ SOLN
INTRAMUSCULAR | Status: AC
  Administered 2013-02-04: 0.5 mg via INTRAVENOUS
  Filled 2013-02-04: qty 1

## 2013-02-04 MED ORDER — HYDROMORPHONE HCL PF 1 MG/ML IJ SOLN
0.2500 mg | INTRAMUSCULAR | Status: DC | PRN
  Administered 2013-02-04 (×2): 0.5 mg via INTRAVENOUS

## 2013-02-04 MED ORDER — INDIGOTINDISULFONATE SODIUM 8 MG/ML IJ SOLN
INTRAMUSCULAR | Status: DC | PRN
  Administered 2013-02-04: 40 mg via INTRAVENOUS

## 2013-02-04 MED ORDER — ROCURONIUM BROMIDE 100 MG/10ML IV SOLN
INTRAVENOUS | Status: DC | PRN
  Administered 2013-02-04: 40 mg via INTRAVENOUS

## 2013-02-04 MED ORDER — ONDANSETRON HCL 4 MG/2ML IJ SOLN
INTRAMUSCULAR | Status: DC | PRN
  Administered 2013-02-04: 4 mg via INTRAVENOUS

## 2013-02-04 MED ORDER — DEXAMETHASONE SODIUM PHOSPHATE 10 MG/ML IJ SOLN
INTRAMUSCULAR | Status: AC
  Filled 2013-02-04: qty 1

## 2013-02-04 MED ORDER — SCOPOLAMINE 1 MG/3DAYS TD PT72
MEDICATED_PATCH | TRANSDERMAL | Status: AC
  Filled 2013-02-04: qty 1

## 2013-02-04 MED ORDER — IBUPROFEN 600 MG PO TABS
600.0000 mg | ORAL_TABLET | Freq: Four times a day (QID) | ORAL | Status: DC | PRN
  Filled 2013-02-04: qty 1

## 2013-02-04 MED ORDER — TRAMADOL HCL 50 MG PO TABS
50.0000 mg | ORAL_TABLET | Freq: Four times a day (QID) | ORAL | Status: DC | PRN
  Administered 2013-02-06: 50 mg via ORAL
  Filled 2013-02-04: qty 1

## 2013-02-04 MED ORDER — MIDAZOLAM HCL 2 MG/2ML IJ SOLN
INTRAMUSCULAR | Status: AC
  Filled 2013-02-04: qty 2

## 2013-02-04 MED ORDER — NALOXONE HCL 0.4 MG/ML IJ SOLN: 0.4000 mg | INTRAMUSCULAR | Status: DC | PRN

## 2013-02-04 MED ORDER — TEMAZEPAM 15 MG PO CAPS: 15.0000 mg | ORAL_CAPSULE | Freq: Every evening | ORAL | Status: DC | PRN

## 2013-02-04 MED ORDER — LIDOCAINE HCL (CARDIAC) 20 MG/ML IV SOLN
INTRAVENOUS | Status: AC
  Filled 2013-02-04: qty 5

## 2013-02-04 MED ORDER — INDIGOTINDISULFONATE SODIUM 8 MG/ML IJ SOLN
INTRAMUSCULAR | Status: AC
  Filled 2013-02-04: qty 5

## 2013-02-04 MED ORDER — DEXAMETHASONE SODIUM PHOSPHATE 4 MG/ML IJ SOLN
INTRAMUSCULAR | Status: DC | PRN
  Administered 2013-02-04: 10 mg via INTRAVENOUS

## 2013-02-04 SURGICAL SUPPLY — 37 items
ADH SKN CLS APL DERMABOND .7 (GAUZE/BANDAGES/DRESSINGS) ×3
BRR ADH 6X5 SEPRAFILM 1 SHT (MISCELLANEOUS)
CANISTER SUCTION 2500CC (MISCELLANEOUS) ×4 IMPLANT
CHLORAPREP W/TINT 26ML (MISCELLANEOUS) ×4 IMPLANT
CLOTH BEACON ORANGE TIMEOUT ST (SAFETY) ×4 IMPLANT
DECANTER SPIKE VIAL GLASS SM (MISCELLANEOUS) IMPLANT
DERMABOND ADVANCED (GAUZE/BANDAGES/DRESSINGS) ×1
DERMABOND ADVANCED .7 DNX12 (GAUZE/BANDAGES/DRESSINGS) ×3 IMPLANT
GAUZE SPONGE 4X4 16PLY XRAY LF (GAUZE/BANDAGES/DRESSINGS) IMPLANT
GLOVE BIO SURGEON STRL SZ 6.5 (GLOVE) ×4 IMPLANT
GOWN PREVENTION PLUS LG XLONG (DISPOSABLE) ×14 IMPLANT
NDL HYPO 25X1 1.5 SAFETY (NEEDLE) IMPLANT
NEEDLE HYPO 22GX1.5 SAFETY (NEEDLE) ×4 IMPLANT
NEEDLE HYPO 25X1 1.5 SAFETY (NEEDLE) IMPLANT
NS IRRIG 1000ML POUR BTL (IV SOLUTION) ×4 IMPLANT
PACK ABDOMINAL GYN (CUSTOM PROCEDURE TRAY) ×4 IMPLANT
PAD OB MATERNITY 4.3X12.25 (PERSONAL CARE ITEMS) ×4 IMPLANT
PROTECTOR NERVE ULNAR (MISCELLANEOUS) ×4 IMPLANT
SEPRAFILM MEMBRANE 5X6 (MISCELLANEOUS) IMPLANT
SPONGE LAP 18X18 X RAY DECT (DISPOSABLE) ×8 IMPLANT
STAPLER VISISTAT 35W (STAPLE) IMPLANT
SUT PDS AB 0 CT 36 (SUTURE) IMPLANT
SUT PDS AB 0 CTX 60 (SUTURE) IMPLANT
SUT PLAIN 2 0 XLH (SUTURE) IMPLANT
SUT VIC AB 0 CT1 18XCR BRD8 (SUTURE) ×6 IMPLANT
SUT VIC AB 0 CT1 27 (SUTURE) ×16
SUT VIC AB 0 CT1 27XBRD ANBCTR (SUTURE) ×12 IMPLANT
SUT VIC AB 0 CT1 8-18 (SUTURE) ×8
SUT VIC AB 3-0 PS1 18 (SUTURE)
SUT VIC AB 3-0 PS1 18X BRD (SUTURE) IMPLANT
SUT VIC AB 4-0 KS 27 (SUTURE) ×4 IMPLANT
SUT VICRYL 0 TIES 12 18 (SUTURE) ×4 IMPLANT
SYR CONTROL 10ML LL (SYRINGE) ×2 IMPLANT
SYRINGE 10CC LL (SYRINGE) ×4 IMPLANT
TOWEL OR 17X24 6PK STRL BLUE (TOWEL DISPOSABLE) ×10 IMPLANT
TRAY FOLEY CATH 14FR (SET/KITS/TRAYS/PACK) ×6 IMPLANT
WATER STERILE IRR 1000ML POUR (IV SOLUTION) ×2 IMPLANT

## 2013-02-04 NOTE — Brief Op Note (Signed)
02/04/2013  9:21 AM  PATIENT:  Josefine Class  52 y.o. 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  PROCEDURE:  Procedure(s) with comments: HYSTERECTOMY SUPRACERVICAL ABDOMINAL (N/A) - Biopsy of left adnexal mass with frozen section FOREIGN BODY REMOVAL (N/A) LAPAROSCOPIC LYSIS OF ADHESIONS (N/A) CYSTOSCOPY (N/A)  SURGEON:  Surgeon(s) and Role:    * Jeani Hawking, MD - Primary    * Leslie Andrea, MD - Assisting  PHYSICIAN ASSISTANT:   ASSISTANTS: none   ANESTHESIA:   general  EBL:  Total I/O In: 1200 [I.V.:1200] Out: 300 [Urine:150; Blood:150]  BLOOD ADMINISTERED:none  DRAINS: Urinary Catheter (Foley)   LOCAL MEDICATIONS USED:  NONE  SPECIMEN:  Source of Specimen:  uterus and portion of left adnexal mass  DISPOSITION OF SPECIMEN:  PATHOLOGY  COUNTS:  YES  TOURNIQUET:  * No tourniquets in log *  DICTATION: .Other Dictation: Dictation Number [id number]  PLAN OF CARE: Admit to inpatient   PATIENT DISPOSITION:  PACU - hemodynamically stable.   Delay start of Pharmacological VTE agent (>24hrs) due to surgical blood loss or risk of bleeding: not applicable

## 2013-02-04 NOTE — Anesthesia Postprocedure Evaluation (Signed)
  Anesthesia Post-op Note  Patient: Erica Yu  Procedure(s) Performed: Procedure(s) with comments: HYSTERECTOMY SUPRACERVICAL ABDOMINAL (N/A) - Biopsy of left adnexal mass with frozen section FOREIGN BODY REMOVAL (N/A) LAPAROSCOPIC LYSIS OF ADHESIONS (N/A) CYSTOSCOPY (N/A)  Patient Location: PACU and Women's Unit  Anesthesia Type:General  Level of Consciousness: awake, alert , oriented and patient cooperative  Airway and Oxygen Therapy: Patient Spontanous Breathing  Post-op Pain: none  Post-op Assessment: Post-op Vital signs reviewed, Patient's Cardiovascular Status Stable and Respiratory Function Stable  Post-op Vital Signs: Reviewed and stable  Complications: No apparent anesthesia complications

## 2013-02-04 NOTE — Anesthesia Preprocedure Evaluation (Addendum)
Anesthesia Evaluation  Patient identified by MRN, date of birth, ID band Patient awake    Reviewed: Allergy & Precautions, H&P , NPO status , Patient's Chart, lab work & pertinent test results  History of Anesthesia Complications (+) PONV  Airway Mallampati: II TM Distance: >3 FB Neck ROM: Full    Dental no notable dental hx.    Pulmonary neg pulmonary ROS,  breath sounds clear to auscultation  Pulmonary exam normal       Cardiovascular negative cardio ROS  Rhythm:Regular Rate:Normal     Neuro/Psych negative neurological ROS  negative psych ROS   GI/Hepatic negative GI ROS, Neg liver ROS,   Endo/Other  negative endocrine ROS  Renal/GU negative Renal ROS  negative genitourinary   Musculoskeletal negative musculoskeletal ROS (+)   Abdominal   Peds negative pediatric ROS (+)  Hematology negative hematology ROS (+)   Anesthesia Other Findings   Reproductive/Obstetrics negative OB ROS                          Anesthesia Physical Anesthesia Plan  ASA: II  Anesthesia Plan: General   Post-op Pain Management:    Induction: Intravenous  Airway Management Planned: Oral ETT  Additional Equipment:   Intra-op Plan:   Post-operative Plan: Extubation in OR  Informed Consent: I have reviewed the patients History and Physical, chart, labs and discussed the procedure including the risks, benefits and alternatives for the proposed anesthesia with the patient or authorized representative who has indicated his/her understanding and acceptance.   Dental advisory given  Plan Discussed with: CRNA  Anesthesia Plan Comments: (Scop patch pre-op and triple cover intra op for PONV)       Anesthesia Quick Evaluation

## 2013-02-04 NOTE — H&P (Signed)
52 year old female with symptomatic fibroids. She has menorrhagia and has had iron deficiency requiring iron transfusions.  Med History negative Surg History negative  Allergic to Penicillin  Meds None  Afebrile Vital signs stable General alert and oriented Lung CTAB Car RRR Abdomen is soft and non tender Uterus is 16 week size fibroids  IMPRESSION: Symptomatic Fibroids  PLAN: TAH Risks discussed Consent signed

## 2013-02-04 NOTE — Anesthesia Postprocedure Evaluation (Signed)
  Anesthesia Post-op Note  Patient: Erica Yu  Procedure(s) Performed: Procedure(s) with comments: HYSTERECTOMY SUPRACERVICAL ABDOMINAL (N/A) - Biopsy of left adnexal mass with frozen section FOREIGN BODY REMOVAL (N/A) LAPAROSCOPIC LYSIS OF ADHESIONS (N/A) CYSTOSCOPY (N/A)  Patient is awake and responsive. Pain and nausea are reasonably well controlled. Vital signs are stable and clinically acceptable. Oxygen saturation is clinically acceptable. There are no apparent anesthetic complications at this time. Patient is ready for discharge.

## 2013-02-04 NOTE — Transfer of Care (Signed)
Immediate Anesthesia Transfer of Care Note  Patient: Erica Yu  Procedure(s) Performed: Procedure(s) with comments: HYSTERECTOMY SUPRACERVICAL ABDOMINAL (N/A) - Biopsy of left adnexal mass FOREIGN BODY REMOVAL (N/A) LAPAROSCOPIC LYSIS OF ADHESIONS (N/A) CYSTOSCOPY (N/A)  Patient Location: PACU  Anesthesia Type:General  Level of Consciousness: awake and oriented  Airway & Oxygen Therapy: Patient Spontanous Breathing and Patient connected to nasal cannula oxygen  Post-op Assessment: Report given to PACU RN and Post -op Vital signs reviewed and stable  Post vital signs: Reviewed and stable  Complications: No apparent anesthesia complications

## 2013-02-04 NOTE — Op Note (Signed)
Erica Yu, Erica Yu              ACCOUNT NO.:  [id number]  MEDICAL RECORD NO.:  [id number]  LOCATION:  9303                          FACILITY:  [facility]  PHYSICIAN:  Fotini Lemus L. Roxy Mastandrea, M.D.DATE OF BIRTH:  12/04/1960  DATE OF PROCEDURE:  02/04/2013 DATE OF DISCHARGE:                              OPERATIVE REPORT   PREOPERATIVE DIAGNOSIS:  Symptomatic fibroids and anemia.  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  PROCEDURE:  Exploratory laparotomy, supracervical hysterectomy, biopsy of left adnexal mass, extensive lysis of adhesions, removal of foreign body material and cystoscopy.  SURGEON:  Cristofer Yaffe L. Vincente Poli, MD  ASSISTANT:  Guy Sandifer. Henderson Cloud, MD  ANESTHESIA:  General.  EBL:  200 mL.  COMPLICATIONS:  None.  PROCEDURE:  The patient had been consented about the risk associated with the surgery which included injury to internal organs such as bowel and bladder, excessive bleeding, venous thromboembolism, infection risk associated with anesthesia.  She had prior to the surgery had elected and did not want to have her ovaries removed.  The patient was taken to the operating room.  She was intubated.  She was prepped and draped.  A Foley catheter was inserted and draining clear urine.  A small incision was made Pfannenstiel at the area of the previous myomectomy scar.  It was carried down to the fascia.  Fascia scored in the midline and extended laterally.  The rectus muscles were separated in midline.  The peritoneum was then opened.  The peritoneal incision was then stretched. We immediately noticed that the uterus was enlarged with almost all the way up to the umbilicus and at the fundal area of the uterus, we noticed there appeared to be some foreign body material adherent to the uterus that was rectangular shaped like a patch.  This may have been from  her previous myomectomy.  We were not able to see the adnexa at this point. We did see that the colon was adherent to the fundus, so we used Metzenbaum and took that down with some sharp dissection and blunt dissection.  This was done without any injury to the bowel.  When we were able to inspect posteriorly, we could see that there was a plastic type of material which appeared to be foreign body adherent between the uterus and the rectosigmoid.  I think this was from the myomectomy but I am not sure what kind of material this is and we were able to resect some of that but there was some of that was still adherent to the rectosigmoid.  The uterus had large fibroids.  We took a towel clamp and elevated the uterus.  At this point, we could not even see the right or the left ovary, so we started on the right side of the pelvis by placing the Heaney clamp across the triple pedicle.  The pedicle was clamped, cut, and suture ligated using 0 Vicryl suture.  We did the same thing on the left side.  We were then able to work on the bladder flap which we did very carefully because the bladder flap was pulled out very high from the previous  surgery, I believe and we used sharp dissection to take that down.  Once we were able to elevate the uterus, we then were able to see a portion of what appeared to be the left ovary and the left ovary appeared extremely abnormal in appearance.  It was grossly enlarged.  There were multiple frondlike projections and papillary projections and grape-like clusters, and there was nothing that appeared to be normal ovary.  The ovary was adherent to the sidewall a little bit.  We could not discern a definite fallopian tube.  Our concern immediately was that this may be a fallopian tube or ovarian cancer, so we did biopsy portion of this and sent it for frozen section.  While we were waiting for frozen section, we completed the hysterectomy by placing curved Heaney  clamps across the internal os and the pedicles were secured using 0 Vicryl suture.  At this point, we were able to amputate the fundus of the uterus because we were taking care of adhesions in the front and in the back and we were able to do that with the knife leaving just the cervix.  At this point, we inspected on the right side.  I could not directly visualize the right ovary, but there was a projection mass that we could feel that was cystic and enlarged on the right side that we think may be the right ovary.  There were still some adhesions in the cul-de-sac.  The pathologist reviewed the frozen section and said this is very highly suspicious for carcinoma.  At this point, because of that we felt that she will need proper ovarian debulking with the GYN oncologist.  We then oversewed the cervical stump with interrupted and left the cervix in situ.  We decided to not try to remove that ovary because we wanted to leave it in place because that needed a very wide excision and possible placement of ureteral stents because of concern the tumor might be near the ureter.  We removed all instruments from the abdominal cavity.  We closed the peritoneum using 0 Vicryl.  We closed the fascia using 0 Vicryl and we closed the skin using 4-0 Vicryl on a Keith needle.  Dermabond was applied.  All sponge, lap, and instrument counts were correct x2, and the patient went to recovery room in stable condition.  I did discuss the operative findings with her husband.  I am going to contact GYN Oncology at [facility] today and arrange for her proper and timely followup with them in regards to her next course of treatment.  We will obtain the CA-125 today.     Tariq Pernell L. Vincente Poli, M.D.     Florestine Avers  D:  02/04/2013  T:  02/04/2013  Job:  [id number]

## 2013-02-05 MED ORDER — ENOXAPARIN (LOVENOX) PATIENT EDUCATION KIT
PACK | Freq: Once | Status: DC
  Filled 2013-02-05: qty 1

## 2013-02-05 NOTE — Progress Notes (Signed)
1 Day Post-Op Procedure(s) (LRB): HYSTERECTOMY SUPRACERVICAL ABDOMINAL (N/A) FOREIGN BODY REMOVAL (N/A) LAPAROSCOPIC LYSIS OF ADHESIONS (N/A) CYSTOSCOPY (N/A)  Subjective: Patient reports tolerating PO and + flatus.    Objective: I have reviewed patient's vital signs, intake and output, medications and labs.  General: alert Abdomen is soft , flat and non tender Incision is clean and dry  Assessment: s/p Procedure(s) with comments: HYSTERECTOMY SUPRACERVICAL ABDOMINAL (N/A) - Biopsy of left adnexal mass with frozen section FOREIGN BODY REMOVAL (N/A) LAPAROSCOPIC LYSIS OF ADHESIONS (N/A) CYSTOSCOPY (N/A): stable  Plan: Advance diet Encourage ambulation Advance to PO medication Discontinue IV fluids  LOS: 1 day    Erica Yu L 02/05/2013, 8:17 AM

## 2013-02-05 NOTE — Progress Notes (Signed)
Erica Yu is coping as well as can be expected.  Now that she knows the facts, she is taking things one day at a time.  She is aware of on-going availability of chaplain support for herself and for her family.  [address] [place] Pager, [phone] 9:35 AM   02/05/13 0900  Clinical Encounter Type  Visited With Patient  Visit Type Spiritual support;Initial  Spiritual Encounters  Spiritual Needs Emotional  Stress Factors  Patient Stress Factors Health changes

## 2013-02-06 ENCOUNTER — Encounter (HOSPITAL_COMMUNITY): Payer: Self-pay | Admitting: Obstetrics and Gynecology

## 2013-02-06 MED ORDER — IBUPROFEN 600 MG PO TABS: 600.0000 mg | ORAL_TABLET | Freq: Four times a day (QID) | ORAL | Status: AC | PRN

## 2013-02-06 MED ORDER — TRAMADOL HCL 50 MG PO TABS: 50.0000 mg | ORAL_TABLET | Freq: Four times a day (QID) | ORAL | Status: DC | PRN

## 2013-02-06 NOTE — Progress Notes (Signed)
2 Days Post-Op Procedure(s) (LRB): HYSTERECTOMY SUPRACERVICAL ABDOMINAL (N/A) FOREIGN BODY REMOVAL (N/A) LAPAROSCOPIC LYSIS OF ADHESIONS (N/A) CYSTOSCOPY (N/A)  Subjective: Patient reports tolerating PO, + flatus and no problems voiding.    Objective: I have reviewed patient's vital signs, intake and output and medications.  General: alert and cooperative Vaginal Bleeding: none Abdomen is soft and non tender and incision is clean, dry and intact  Assessment: s/p Procedure(s) with comments: HYSTERECTOMY SUPRACERVICAL ABDOMINAL (N/A) - Biopsy of left adnexal mass with frozen section FOREIGN BODY REMOVAL (N/A) LAPAROSCOPIC LYSIS OF ADHESIONS (N/A) CYSTOSCOPY (N/A): stable, progressing well and tolerating diet  Plan: Advance diet Encourage ambulation Advance to PO medication Discharge home Follow up in 1 week  LOS: 2 days    Deakon Frix L 02/06/2013, 9:06 AM

## 2013-02-06 NOTE — Discharge Summary (Signed)
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 She has a history of myomectomy and uterine artery embolization. When I saw her for her annual exam in February, she reported heavy periods which was unchanged and her hemoglobin was 7. I referred her to a hematologist who performed iron infusion and we elected to perform TAH after that. On the day of admission her hemoglobin was 12. At the time of surgery, we noted a very large myomatous uterus but also an unusual left adnexal mass. Frozen section of this mass revealed carcinoma. Because of this finding, we knew that a surgical staging/debulking was necessary and a gyn oncologist was noted available.  We elected to perform a supracervical hysterectomy to prevent entry into the vagina.   She tolerated the surgery well. The patient and her family were counseled by myself extensively after the surgery. On the day of surgery, CA 125 was obtained and was 27.  I contacted Dr. De Blanch of [facility] and an appointment was made for May 8. He is planning a staging/debulking surgery in 2 to 3 weeks from now. 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  In the hospital, she had a very good post op course. She went home in good spirits on POD #2. She was discharged with Ibuprofen and Tramadol. She will follow -up with me in 1 week No driving for 1 week.

## 2013-02-06 NOTE — Progress Notes (Signed)
Discharge instructions reviewed with patient.  Patient states understanding of home care, medications, activity, signs/symptoms to report to MD and follow up MD appointments.  Patient given " Recovering From Your Surgery" pamphlet.  No home equipment needed.  Patient discharged in stable condition with staff via wheelchair without incident.

## 2013-04-07 ENCOUNTER — Ambulatory Visit: Payer: BC Managed Care – PPO | Admitting: Oncology

## 2013-04-07 ENCOUNTER — Other Ambulatory Visit: Payer: BC Managed Care – PPO | Admitting: Lab

## 2013-12-06 ENCOUNTER — Other Ambulatory Visit: Payer: Self-pay | Admitting: Obstetrics and Gynecology

## 2014-12-12 ENCOUNTER — Other Ambulatory Visit: Payer: Self-pay | Admitting: Obstetrics and Gynecology

## 2014-12-13 LAB — CYTOLOGY - PAP

## 2016-05-01 ENCOUNTER — Encounter: Payer: Self-pay | Admitting: Gastroenterology

## 2016-06-05 ENCOUNTER — Ambulatory Visit (AMBULATORY_SURGERY_CENTER): Payer: Self-pay

## 2016-06-05 VITALS — Ht 68.0 in | Wt 147.0 lb

## 2016-06-05 DIAGNOSIS — Z1211 Encounter for screening for malignant neoplasm of colon: Secondary | ICD-10-CM

## 2016-06-05 MED ORDER — SUPREP BOWEL PREP KIT 17.5-3.13-1.6 GM/177ML PO SOLN
1.0000 | Freq: Once | ORAL | 0 refills | Status: AC
Start: 2016-06-05 — End: 2016-06-05

## 2016-06-05 NOTE — Progress Notes (Signed)
No allergies to eggs or soy No diet meds No home oxygen No past problems with anesthesia  Has email and internet; registered for emmi

## 2016-07-03 ENCOUNTER — Encounter: Payer: Self-pay | Admitting: Gastroenterology

## 2016-07-17 ENCOUNTER — Ambulatory Visit (AMBULATORY_SURGERY_CENTER): Payer: BC Managed Care – PPO | Admitting: Gastroenterology

## 2016-07-17 ENCOUNTER — Encounter: Payer: Self-pay | Admitting: Gastroenterology

## 2016-07-17 VITALS — BP 112/63 | HR 50 | Temp 96.4°F | Resp 10 | Ht 67.5 in | Wt 147.0 lb

## 2016-07-17 DIAGNOSIS — Z1211 Encounter for screening for malignant neoplasm of colon: Secondary | ICD-10-CM | POA: Diagnosis present

## 2016-07-17 MED ORDER — SODIUM CHLORIDE 0.9 % IV SOLN: 500.0000 mL | INTRAVENOUS | Status: AC

## 2016-07-17 NOTE — Progress Notes (Signed)
No egg or soy allergy known to patient

## 2016-07-17 NOTE — Op Note (Signed)
[facility] Patient Name: Erica Yu Procedure Date: 07/17/2016 8:57 AM MRN: [id number] Endoscopist: Remo Lipps P. Havery Moros , MD Age: 55 Referring MD:  Date of Birth: [birth year] Gender: Female Account #: [id number] Procedure:                Colonoscopy Indications:              Screening for malignant neoplasm in the colon, This                            is reported to be the patient's first colonoscopy Medicines:                Monitored Anesthesia Care Procedure:                Pre-Anesthesia Assessment:                           - Prior to the procedure, a History and Physical                            was performed, and patient medications and                            allergies were reviewed. The patient's tolerance of                            previous anesthesia was also reviewed. The risks                            and benefits of the procedure and the sedation                            options and risks were discussed with the patient.                            All questions were answered, and informed consent                            was obtained. Prior Anticoagulants: The patient has                            taken no previous anticoagulant or antiplatelet                            agents. ASA Grade Assessment: II - A patient with                            mild systemic disease. After reviewing the risks                            and benefits, the patient was deemed in                            satisfactory condition to undergo the procedure.  After obtaining informed consent, the colonoscope                            was passed under direct vision. Throughout the                            procedure, the patient's blood pressure, pulse, and                            oxygen saturations were monitored continuously. The                            Model PCF-H190DL [id number]) scope was introduced   through the anus and advanced to the the cecum,                            identified by appendiceal orifice and ileocecal                            valve. The colonoscopy was performed without                            difficulty. The patient tolerated the procedure                            well. The quality of the bowel preparation was                            good. The ileocecal valve, appendiceal orifice, and                            rectum were photographed. Scope In: 9:04:51 AM Scope Out: 9:23:20 AM Scope Withdrawal Time: 0 hours 16 minutes 15 seconds  Total Procedure Duration: 0 hours 18 minutes 29 seconds  Findings:                 The perianal and digital rectal examinations were                            normal.                           There were changes in the rectum consistent with                            possible surgical anastomosis (no reported history                            of rectal surgery in patient's chart)                           Anal papilla(e) were hypertrophied.                           The exam was otherwise without abnormality on  direct and retroflexion views. No polyps. Of note,                            the colonoscope lost water irrigation during the                            procedure which took a few minutes to fix,                            prolonging this procedure. Complications:            No immediate complications. Estimated blood loss:                            None. Estimated Blood Loss:     Estimated blood loss: none. Impression:               - possible change of surgical anastomosis in the                            rectum                           - Anal papilla(e) were hypertrophied.                           - The examination was otherwise normal on direct                            and retroflexion views. Recommendation:           - Patient has a contact number available for                             emergencies. The signs and symptoms of potential                            delayed complications were discussed with the                            patient. Return to normal activities tomorrow.                            Written discharge instructions were provided to the                            patient.                           - Resume previous diet.                           - Continue present medications.                           - Repeat colonoscopy in 10 years for screening  purposes. Remo Lipps P. [name], MD 07/17/2016 9:28:52 AM This report has been signed electronically.

## 2016-07-17 NOTE — Progress Notes (Signed)
No problems noted in the recovery room. maw 

## 2016-07-17 NOTE — Patient Instructions (Signed)
YOU HAD AN ENDOSCOPIC PROCEDURE TODAY AT [facility]:   Refer to the procedure report that was given to you for any specific questions about what was found during the examination.  If the procedure report does not answer your questions, please call your gastroenterologist to clarify.  If you requested that your care partner not be given the details of your procedure findings, then the procedure report has been included in a sealed envelope for you to review at your convenience later.  YOU SHOULD EXPECT: Some feelings of bloating in the abdomen. Passage of more gas than usual.  Walking can help get rid of the air that was put into your GI tract during the procedure and reduce the bloating. If you had a lower endoscopy (such as a colonoscopy or flexible sigmoidoscopy) you may notice spotting of blood in your stool or on the toilet paper. If you underwent a bowel prep for your procedure, you may not have a normal bowel movement for a few days.  Please Note:  You might notice some irritation and congestion in your nose or some drainage.  This is from the oxygen used during your procedure.  There is no need for concern and it should clear up in a day or so.  SYMPTOMS TO REPORT IMMEDIATELY:   Following lower endoscopy (colonoscopy or flexible sigmoidoscopy):  Excessive amounts of blood in the stool  Significant tenderness or worsening of abdominal pains  Swelling of the abdomen that is new, acute  Fever of 100F or higher   Following upper endoscopy (EGD)  Vomiting of blood or coffee ground material  New chest pain or pain under the shoulder blades  Painful or persistently difficult swallowing  New shortness of breath  Fever of 100F or higher  Black, tarry-looking stools  For urgent or emergent issues, a gastroenterologist can be reached at any hour by calling [phone].   DIET:  We do recommend a small meal at first, but then you may proceed to your regular diet.  Drink  plenty of fluids but you should avoid alcoholic beverages for 24 hours.  ACTIVITY:  You should plan to take it easy for the rest of today and you should NOT DRIVE or use heavy machinery until tomorrow (because of the sedation medicines used during the test).    FOLLOW UP: Our staff will call the number listed on your records the next business day following your procedure to check on you and address any questions or concerns that you may have regarding the information given to you following your procedure. If we do not reach you, we will leave a message.  However, if you are feeling well and you are not experiencing any problems, there is no need to return our call.  We will assume that you have returned to your regular daily activities without incident.  If any biopsies were taken you will be contacted by phone or by letter within the next 1-3 weeks.  Please call us at [phone] if you have not heard about the biopsies in 3 weeks.    SIGNATURES/CONFIDENTIALITY: You and/or your care partner have signed paperwork which will be entered into your electronic medical record.  These signatures attest to the fact that that the information above on your After Visit Summary has been reviewed and is understood.  Full responsibility of the confidentiality of this discharge information lies with you and/or your care-partner.     You may resume your current medications today. Next  screening colonoscopy is in 10 years. Please call if any questions or concerns.

## 2016-07-17 NOTE — Progress Notes (Signed)
To recovery VSS Report to [employer] RN

## 2016-07-18 ENCOUNTER — Telehealth: Payer: Self-pay | Admitting: *Deleted

## 2016-07-18 NOTE — Telephone Encounter (Signed)
  Follow up Call-  Call back number 07/17/2016  Post procedure Call Back phone  # [phone]  Permission to leave phone message Yes  Some recent data might be hidden     Patient questions:  Do you have a fever, pain , or abdominal swelling? No. Pain Score  0 *  Have you tolerated food without any problems? Yes.    Have you been able to return to your normal activities? Yes.    Do you have any questions about your discharge instructions: Diet   No. Medications  No. Follow up visit  No.  Do you have questions or concerns about your Care? No.  Actions: * If pain score is 4 or above: No action needed, pain <4.

## 2018-01-05 ENCOUNTER — Ambulatory Visit
Admission: RE | Admit: 2018-01-05 | Discharge: 2018-01-05 | Disposition: A | Discharge status: Home / Self Care | Payer: BC Managed Care – PPO | Source: Ambulatory Visit | Attending: Internal Medicine | Admitting: Internal Medicine

## 2018-01-05 ENCOUNTER — Other Ambulatory Visit: Payer: Self-pay | Admitting: Internal Medicine

## 2018-01-05 DIAGNOSIS — R05 Cough: Secondary | ICD-10-CM

## 2018-01-05 DIAGNOSIS — R059 Cough, unspecified: Secondary | ICD-10-CM

## 2019-05-23 IMAGING — DX DG CHEST 2V
2 series · 2 of 2 positions shown · non-contrast
Comparison: None in PACs

CLINICAL DATA: One month of cough, nonsmoker.

EXAM:
CHEST - 2 VIEW

[dg chest 2 view (1 of 2)]
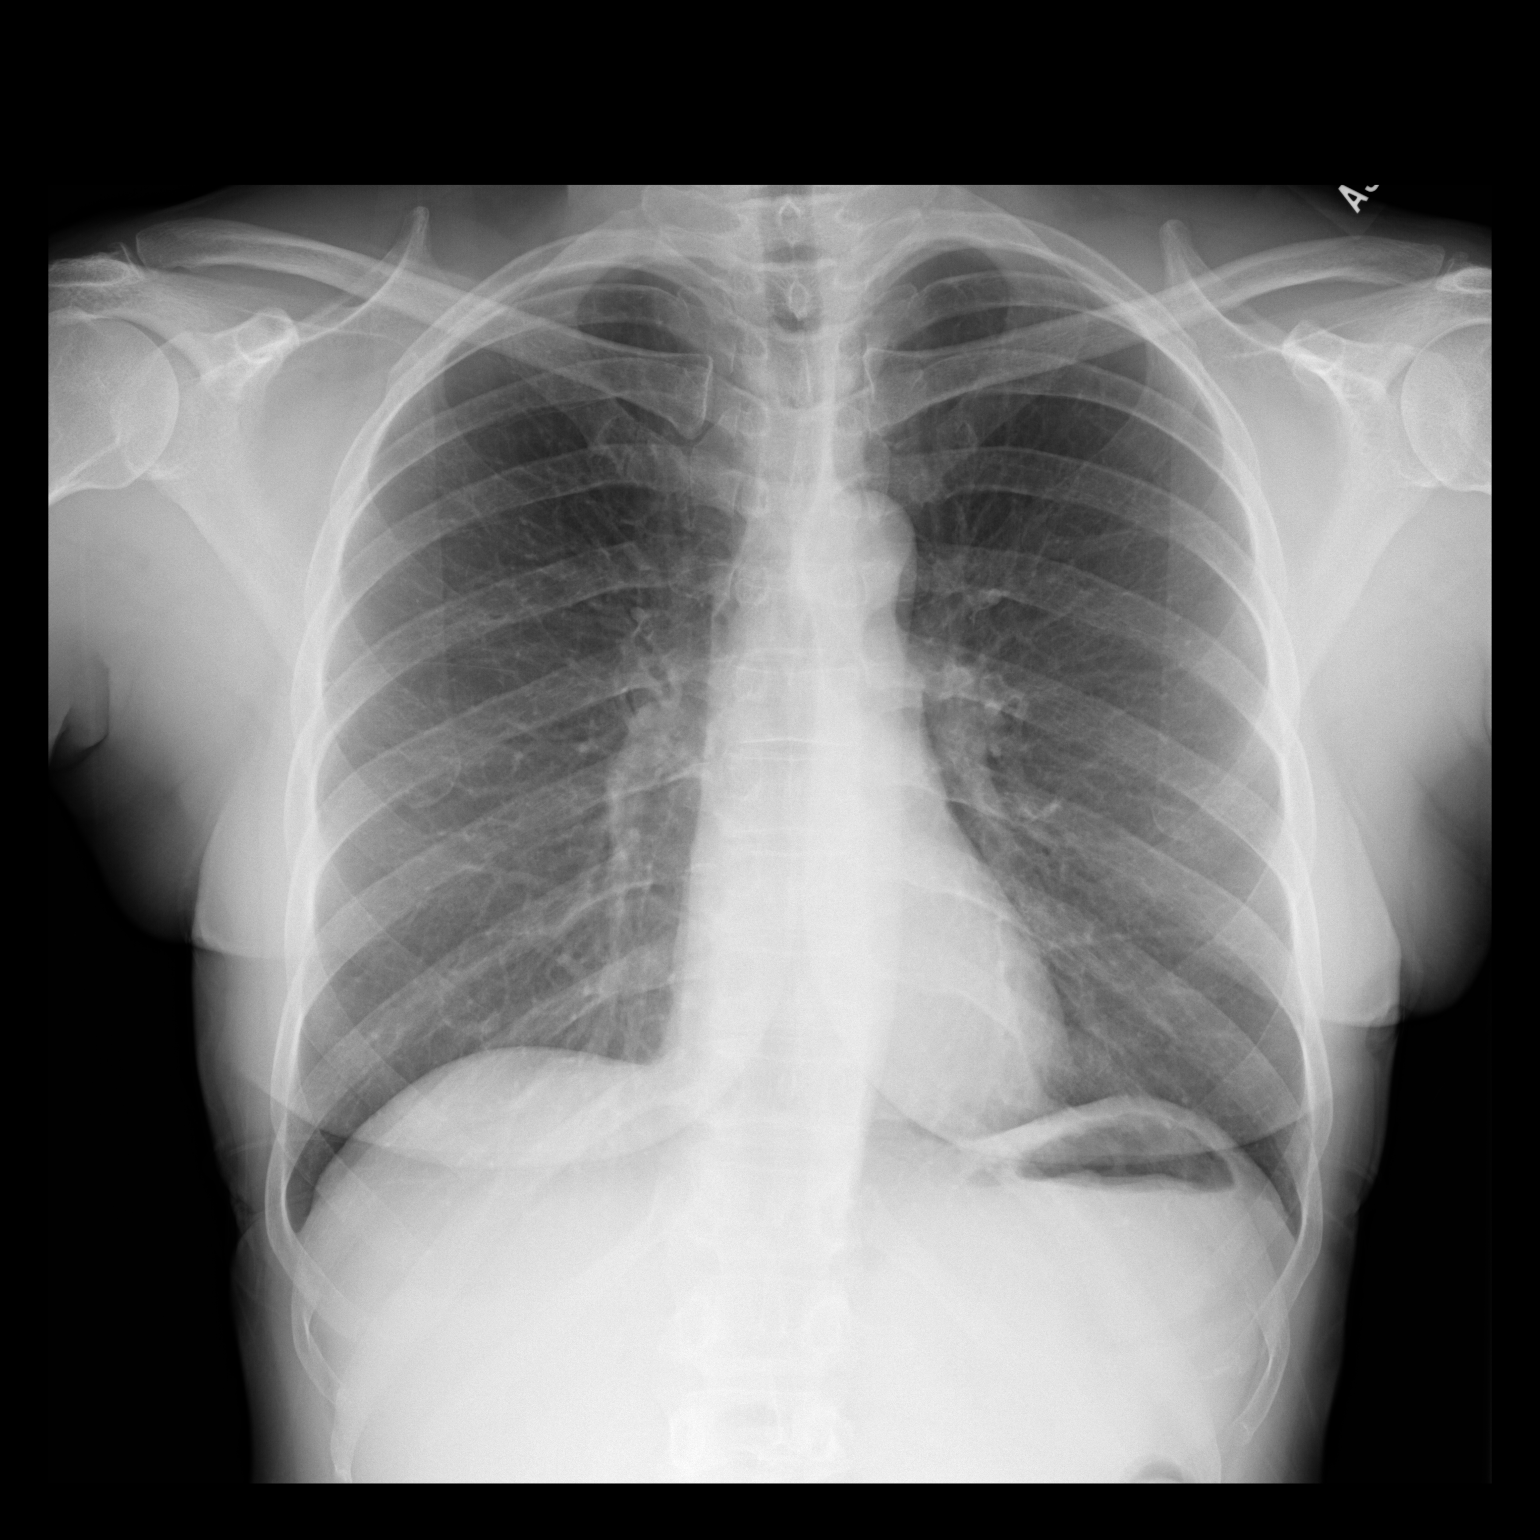

[dg chest 2 view (2 of 2)]
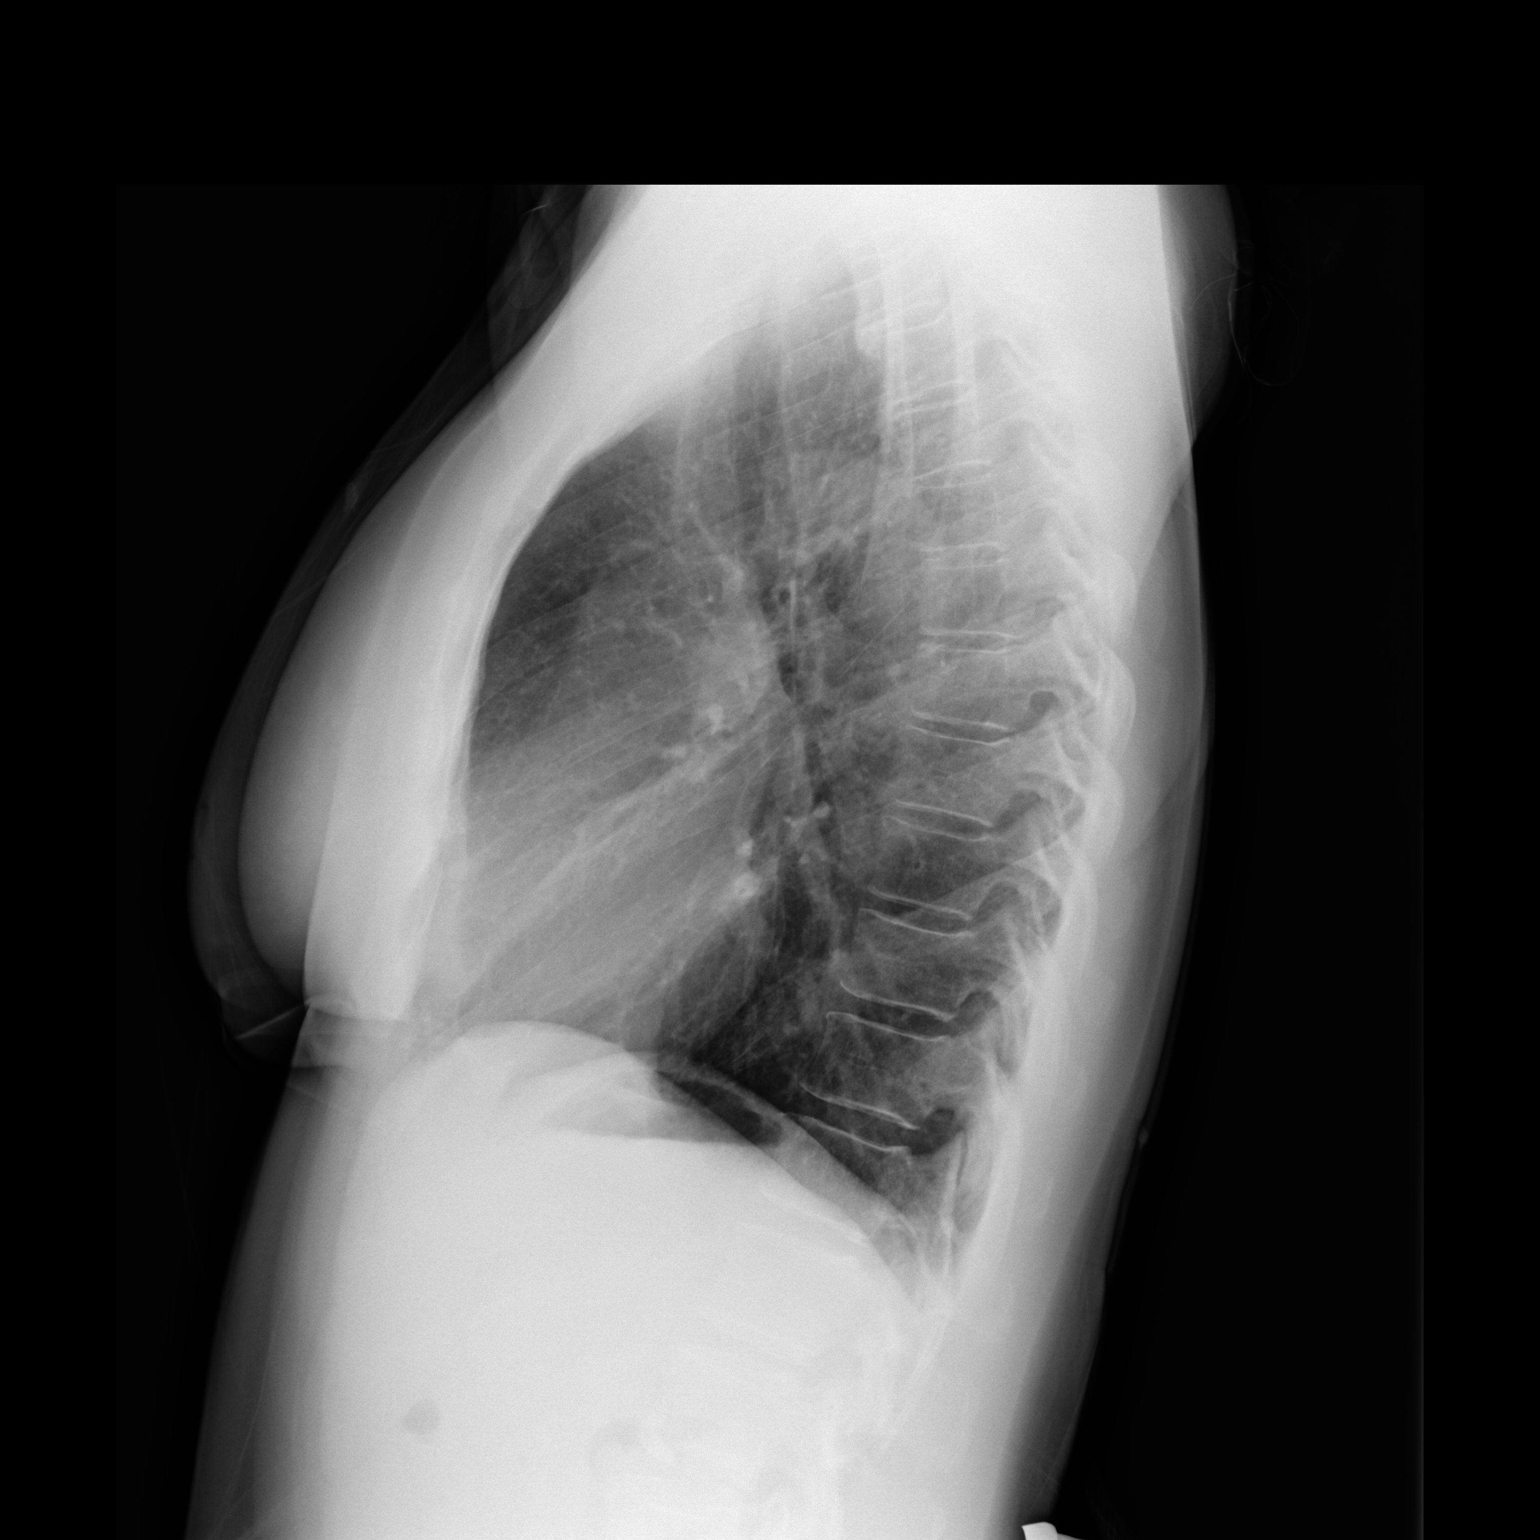

[2 of 2 positions shown; findings below may reference images not displayed]

FINDINGS: The lungs are well-expanded and clear. The heart and pulmonary
vascularity are normal. The mediastinum is normal in width. There is
no pleural effusion. The bony thorax is unremarkable.
IMPRESSION: There is no active cardiopulmonary disease.

## 2019-12-11 ENCOUNTER — Ambulatory Visit: Payer: BC Managed Care – PPO | Attending: Internal Medicine

## 2019-12-11 DIAGNOSIS — Z23 Encounter for immunization: Secondary | ICD-10-CM

## 2019-12-11 NOTE — Progress Notes (Signed)
   Covid-19 Vaccination Clinic  Name:  Erica Yu    MRN: [id number] DOB: [birth year]  12/11/2019  [name] was observed post Covid-19 immunization for 15 minutes without incidence. She was provided with Vaccine Information Sheet and instruction to access the V-Safe system.   [name] was instructed to call 911 with any severe reactions post vaccine: Marland Kitchen Difficulty breathing  . Swelling of your face and throat  . A fast heartbeat  . A bad rash all over your body  . Dizziness and weakness    Immunizations Administered    Name Date Dose VIS Date Route   Pfizer COVID-19 Vaccine 12/11/2019  1:54 PM 0.3 mL 09/24/2019 Intramuscular   Manufacturer: [place]   Lot: [id number]   [place]: [id number]

## 2020-01-01 ENCOUNTER — Ambulatory Visit: Payer: BC Managed Care – PPO | Attending: Internal Medicine

## 2020-01-01 DIAGNOSIS — Z23 Encounter for immunization: Secondary | ICD-10-CM

## 2020-01-01 NOTE — Progress Notes (Signed)
   Covid-19 Vaccination Clinic  Name:  Erica Yu    MRN: [id number] DOB: Nov 28, 1960  01/01/2020  [name] was observed post Covid-19 immunization for 15 minutes without incident. She was provided with Vaccine Information Sheet and instruction to access the V-Safe system.   [name] was instructed to call 911 with any severe reactions post vaccine: Marland Kitchen Difficulty breathing  . Swelling of face and throat  . A fast heartbeat  . A bad rash all over body  . Dizziness and weakness   Immunizations Administered    Name Date Dose VIS Date Route   Pfizer COVID-19 Vaccine 01/01/2020  8:10 AM 0.3 mL 09/24/2019 Intramuscular   Manufacturer: [place]   Lot: [id number]   [place]: [id number]

## 2020-02-15 NOTE — Assessment & Plan Note (Signed)
Negative symptom review, normal exam  >yearly follow-up with a gynecologic generalist appropriate

## 2020-02-15 NOTE — Progress Notes (Addendum)
Follow Up Note: Gyn-Onc  Erica Yu 59 y.o. female  CC: She presents for a follow-up visit  HPI:  She has a h/o a Stage II B fallopian tube carcinoma.  Oncology History  Fallopian tube carcinoma ([place])  03/16/2013 Initial Diagnosis   Fallopian tube carcinoma ([place])   01/22/2018 Tumor Marker   Patient's tumor was tested for the following markers: CA-125. Results of the tumor marker test revealed 3.5.   02/24/2103 Definitive Surgery   radical debulking of a fallopian tube carcinoma including bilateral salpingo-oophorectomy, rectosigmoid resection with anastomosis, resection of the left distal ureter with ureteroneocystostomy and psoas hitch as well as repair of cystotomy. Final pathology showed disease confined to the pelvis. Patient had an uncomplicated postoperative course.    - 07/2013 Chemotherapy   The patient had 6 cycles of dose dense carboplatin and Taxol for chemotherapy treatment.       Interval History: She denies any abdominal pain/distention, weight loss or change in bowel habits.    Review of Systems Review of Systems  Constitutional: Negative for malaise/fatigue and weight loss.  Gastrointestinal: Negative for abdominal pain, constipation, diarrhea, nausea and vomiting.  Genitourinary: Negative for dysuria, frequency and urgency.    Current Meds:  Outpatient Encounter Medications as of 02/16/2020  Medication Sig  . Biotin 1 MG CAPS Take by mouth.  Marland Kitchen ibuprofen (ADVIL,MOTRIN) 600 MG tablet Take 1 tablet (600 mg total) by mouth every 6 (six) hours as needed (mild pain).  . Multiple Vitamin (MULTIVITAMIN) tablet Take 1 tablet by mouth daily.   Facility-Administered Encounter Medications as of 02/16/2020  Medication  . 0.9 %  sodium chloride infusion    Allergy:  Allergies  Allergen Reactions  . Penicillins     Social Hx:   Social History   Socioeconomic History  . Marital  status: Married    Spouse name: Not on file  . Number of children: Not on file  . Years of education: Not on file  . Highest education level: Not on file  Occupational History  . Not on file  Tobacco Use  . Smoking status: Never Smoker  . Smokeless tobacco: Never Used  Substance and Sexual Activity  . Alcohol use: Yes    Comment: socially ocassionally;twice monthly  . Drug use: No  . Sexual activity: Not on file  Other Topics Concern  . Not on file  Social History Narrative  . Not on file   Social Determinants of Health   Financial Resource Strain:   . Difficulty of Paying Living Expenses:   Food Insecurity:   . Worried About Charity fundraiser in the Last Year:   . Arboriculturist in the Last Year:   Transportation Needs:   . Film/video editor (Medical):   Marland Kitchen Lack of Transportation (Non-Medical):   Physical Activity:   . Days of Exercise per Week:   . Minutes of Exercise per Session:   Stress:   . Feeling of Stress :   Social Connections:   . Frequency of Communication with Friends and Family:   . Frequency of Social Gatherings with Friends and Family:   . Attends Religious Services:   . Active Member of Clubs or Organizations:   . Attends Archivist Meetings:   Marland Kitchen Marital Status:   Intimate Partner Violence:   . Fear of Current or Ex-Partner:   . Emotionally Abused:   Marland Kitchen Physically Abused:   . Sexually Abused:     Past Surgical Hx:  Past  Surgical History:  Procedure Laterality Date  . CYSTOSCOPY N/A 02/04/2013   Procedure: CYSTOSCOPY;  Surgeon: [name], MD;  Location: [facility];  Service: Gynecology;  Laterality: N/A;  . FOREIGN BODY REMOVAL N/A 02/04/2013   Procedure: FOREIGN BODY REMOVAL;  Surgeon: [name], MD;  Location: [facility];  Service: Gynecology;  Laterality: N/A;  . LAPAROSCOPIC LYSIS OF ADHESIONS N/A 02/04/2013   Procedure: LAPAROSCOPIC LYSIS OF ADHESIONS;  Surgeon: [name], MD;  Location: [facility];  Service:  Gynecology;  Laterality: N/A;  . MYOMECTOMY VAGINAL APPROACH    . SUPRACERVICAL ABDOMINAL HYSTERECTOMY N/A 02/04/2013   Procedure: HYSTERECTOMY SUPRACERVICAL ABDOMINAL;  Surgeon: [name], MD;  Location: [facility];  Service: Gynecology;  Laterality: N/A;  Biopsy of left adnexal mass with frozen section  . UTERINE FIBROID EMBOLIZATION     2012    Past Medical Hx:  Past Medical History:  Diagnosis Date  . Anemia, unspecified 12/22/2012   Pt. received Fereheme infusion for anemia tx on 01/04/13  . Cancer (HCC)    OVARIAN  . Fibroids 1999  . PONV (postoperative nausea and vomiting)    history of vomiting post op with previos surgery x2    Family Hx:  Family History  Problem Relation Age of Onset  . Colon cancer Cousin 28  . Colon polyps Neg Hx     Vitals:  Last menstrual period 01/18/2013.  Physical Exam: Physical Exam  Constitutional: She appears well-developed and well-nourished. No distress.  GI: Soft. She exhibits no distension and no mass. There is no abdominal tenderness.  Genitourinary:    Vagina normal.     Genitourinary Comments: Rectovaginal exam confirmatory.   Lymphadenopathy:       Right: No inguinal and no supraclavicular adenopathy present.       Left: No inguinal and no supraclavicular adenopathy present.   I personally spent 25 minutes face-to-face and non-face-to-face in the care of this patient, which includes all pre, intra, and post visit time on the date of service.   Fallopian tube carcinoma ([place]) Negative symptom review, normal exam  >yearly follow-up with a gynecologic generalist appropriate   [name], MD 02/15/2020, 1:18 PM

## 2020-02-16 ENCOUNTER — Other Ambulatory Visit: Payer: Self-pay

## 2020-02-16 ENCOUNTER — Encounter: Payer: Self-pay | Admitting: Obstetrics & Gynecology

## 2020-02-16 ENCOUNTER — Inpatient Hospital Stay: Payer: BC Managed Care – PPO | Attending: Obstetrics & Gynecology | Admitting: Obstetrics & Gynecology

## 2020-02-16 ENCOUNTER — Ambulatory Visit: Payer: BC Managed Care – PPO

## 2020-02-16 ENCOUNTER — Inpatient Hospital Stay: Payer: BC Managed Care – PPO

## 2020-02-16 VITALS — BP 126/82 | HR 64 | Temp 98.0°F | Resp 16 | Ht 68.0 in | Wt 147.0 lb

## 2020-02-16 DIAGNOSIS — Z8544 Personal history of malignant neoplasm of other female genital organs: Secondary | ICD-10-CM

## 2020-02-16 DIAGNOSIS — Z90722 Acquired absence of ovaries, bilateral: Secondary | ICD-10-CM | POA: Diagnosis not present

## 2020-02-16 DIAGNOSIS — C5700 Malignant neoplasm of unspecified fallopian tube: Secondary | ICD-10-CM

## 2020-02-16 DIAGNOSIS — Z90710 Acquired absence of both cervix and uterus: Secondary | ICD-10-CM

## 2020-02-16 DIAGNOSIS — Z9221 Personal history of antineoplastic chemotherapy: Secondary | ICD-10-CM | POA: Diagnosis not present

## 2020-02-16 DIAGNOSIS — Z08 Encounter for follow-up examination after completed treatment for malignant neoplasm: Secondary | ICD-10-CM | POA: Diagnosis not present

## 2020-02-16 NOTE — Patient Instructions (Signed)
Return in 1 year ?

## 2020-02-17 ENCOUNTER — Telehealth: Payer: Self-pay

## 2020-02-17 LAB — CA 125: Cancer Antigen (CA) 125: 6.4 U/mL (ref 0.0–38.1)

## 2020-02-17 NOTE — Telephone Encounter (Signed)
Told Erica Yu that her CA-125 was 6.4.  This is WNL per Melissa Cross,NP.

## 2021-02-15 ENCOUNTER — Telehealth: Payer: Self-pay | Admitting: *Deleted

## 2021-02-15 NOTE — Telephone Encounter (Signed)
Returned the patient's call and scheduled a follow up appt for 6/1 with Dr Delsa Sale

## 2021-03-07 ENCOUNTER — Encounter: Payer: Self-pay | Admitting: Oncology

## 2021-03-08 ENCOUNTER — Encounter: Payer: Self-pay | Admitting: Obstetrics & Gynecology

## 2021-03-13 NOTE — Assessment & Plan Note (Signed)
Negative symptom review, normal exam  >yearly follow-up with a gynecologic generalist appropriate

## 2021-03-13 NOTE — Progress Notes (Signed)
Follow Up Note: Gyn-Onc  Erica Yu 60 y.o. female  CC: She presents for a follow-up visit  HPI:  She has a h/o a Stage II B fallopian tube carcinoma.  Oncology History  Fallopian tube carcinoma ([name])  03/16/2013 Initial Diagnosis   Fallopian tube carcinoma ([name])   01/22/2018 Tumor Marker   Patient's tumor was tested for the following markers: CA-125. Results of the tumor marker test revealed 3.5.   02/24/2103 Definitive Surgery   radical debulking of a fallopian tube carcinoma including bilateral salpingo-oophorectomy, rectosigmoid resection with anastomosis, resection of the left distal ureter with ureteroneocystostomy and psoas hitch as well as repair of cystotomy. Final pathology showed disease confined to the pelvis. Patient had an uncomplicated postoperative course.    - 07/2013 Chemotherapy   The patient had 6 cycles of dose dense carboplatin and Taxol for chemotherapy treatment.       Interval History: She denies any abdominal pain/distention, weight loss or change in bowel habits.    Review of Systems Review of Systems  Constitutional: Negative for malaise/fatigue and weight loss.  Gastrointestinal: Negative for abdominal pain, constipation, diarrhea, nausea and vomiting.  Genitourinary: Negative for dysuria, frequency and urgency.    Current Meds:  Outpatient Encounter Medications as of 03/14/2021  Medication Sig  . Biotin 1 MG CAPS Take by mouth.  . clotrimazole-betamethasone (LOTRISONE) cream Apply topically 2 (two) times daily.  . COMBIGAN 0.2-0.5 % ophthalmic solution Apply 1 drop to eye 2 (two) times daily.  . Multiple Vitamin (MULTIVITAMIN) tablet Take 1 tablet by mouth daily.  Marland Kitchen ibuprofen (ADVIL,MOTRIN) 600 MG tablet Take 1 tablet (600 mg total) by mouth every 6 (six) hours as needed (mild pain). (Patient not taking: Reported on 03/08/2021)  . [DISCONTINUED] ROCKLATAN 0.02-0.005 %  SOLN    Facility-Administered Encounter Medications as of 03/14/2021  Medication  . 0.9 %  sodium chloride infusion    Allergy:  Allergies  Allergen Reactions  . Penicillins     Social Hx:   Social History   Socioeconomic History  . Marital status: Married    Spouse name: Not on file  . Number of children: Not on file  . Years of education: Not on file  . Highest education level: Not on file  Occupational History  . Not on file  Tobacco Use  . Smoking status: Never Smoker  . Smokeless tobacco: Never Used  Vaping Use  . Vaping Use: Never used  Substance and Sexual Activity  . Alcohol use: Yes    Comment: socially ocassionally;twice monthly  . Drug use: No  . Sexual activity: Not on file  Other Topics Concern  . Not on file  Social History Narrative  . Not on file   Social Determinants of Health   Financial Resource Strain: Not on file  Food Insecurity: Not on file  Transportation Needs: Not on file  Physical Activity: Not on file  Stress: Not on file  Social Connections: Not on file  Intimate Partner Violence: Not on file    Past Surgical Hx:  Past Surgical History:  Procedure Laterality Date  . CYSTOSCOPY N/A 02/04/2013   Procedure: CYSTOSCOPY;  Surgeon: [name], MD;  Location: [facility];  Service: Gynecology;  Laterality: N/A;  . FOREIGN BODY REMOVAL N/A 02/04/2013   Procedure: FOREIGN BODY REMOVAL;  Surgeon: [name], MD;  Location: [facility];  Service: Gynecology;  Laterality: N/A;  . LAPAROSCOPIC LYSIS OF ADHESIONS N/A 02/04/2013   Procedure: LAPAROSCOPIC LYSIS OF ADHESIONS;  Surgeon:  [name], MD;  Location: [facility];  Service: Gynecology;  Laterality: N/A;  . MYOMECTOMY VAGINAL APPROACH    . SUPRACERVICAL ABDOMINAL HYSTERECTOMY N/A 02/04/2013   Procedure: HYSTERECTOMY SUPRACERVICAL ABDOMINAL;  Surgeon: [name], MD;  Location: [facility];  Service: Gynecology;  Laterality: N/A;  Biopsy of left adnexal mass with frozen section  .  UTERINE FIBROID EMBOLIZATION     2012    Past Medical Hx:  Past Medical History:  Diagnosis Date  . Anemia, unspecified 12/22/2012   Pt. received Fereheme infusion for anemia tx on 01/04/13  . Cancer (HCC)    OVARIAN  . Fibroids 1999  . PONV (postoperative nausea and vomiting)    history of vomiting post op with previos surgery x2    Family Hx:  Family History  Problem Relation Age of Onset  . Colon cancer Cousin 101  . Colon polyps Neg Hx     Vitals:  BP 114/72 (Patient Position: Sitting)   Pulse (!) 56   Temp 98.6 F (37 C) (Oral)   Resp 16   Ht 5\' 8"  (1.727 m)   Wt 145 lb (65.8 kg)   LMP 01/18/2013   SpO2 100% Comment: RA  BMI 22.05 kg/m   Physical Exam: Physical Exam Constitutional:      General: She is not in acute distress.    Appearance: She is well-developed.  Chest:  Breasts:     Right: No supraclavicular adenopathy.     Left: No supraclavicular adenopathy.    Abdominal:     General: There is no distension.     Palpations: Abdomen is soft. There is no mass.     Tenderness: There is no abdominal tenderness.  Genitourinary:    Vagina: Normal.     Comments: Rectovaginal exam confirmatory. Lymphadenopathy:     Upper Body:     Right upper body: No supraclavicular adenopathy.     Left upper body: No supraclavicular adenopathy.     Lower Body: No right inguinal adenopathy. No left inguinal adenopathy.    I personally spent 25 minutes face-to-face and non-face-to-face in the care of this patient, which includes all pre, intra, and post visit time on the date of service.   Fallopian tube carcinoma ([place]) Negative symptom review, normal exam  >yearly follow-up with a gynecologic generalist appropriate   [name], MD 03/13/2021, 10:49 AM

## 2021-03-14 ENCOUNTER — Other Ambulatory Visit: Payer: Self-pay

## 2021-03-14 ENCOUNTER — Encounter: Payer: Self-pay | Admitting: Oncology

## 2021-03-14 ENCOUNTER — Encounter: Payer: Self-pay | Admitting: Obstetrics & Gynecology

## 2021-03-14 ENCOUNTER — Inpatient Hospital Stay: Payer: BC Managed Care – PPO | Attending: Obstetrics & Gynecology | Admitting: Obstetrics & Gynecology

## 2021-03-14 VITALS — BP 114/72 | HR 56 | Temp 98.6°F | Resp 16 | Ht 68.0 in | Wt 145.0 lb

## 2021-03-14 DIAGNOSIS — Z8049 Family history of malignant neoplasm of other genital organs: Secondary | ICD-10-CM | POA: Diagnosis present

## 2021-03-14 DIAGNOSIS — Z90710 Acquired absence of both cervix and uterus: Secondary | ICD-10-CM | POA: Insufficient documentation

## 2021-03-14 DIAGNOSIS — Z90722 Acquired absence of ovaries, bilateral: Secondary | ICD-10-CM | POA: Diagnosis not present

## 2021-03-14 DIAGNOSIS — Z9221 Personal history of antineoplastic chemotherapy: Secondary | ICD-10-CM | POA: Insufficient documentation

## 2021-03-14 DIAGNOSIS — C5700 Malignant neoplasm of unspecified fallopian tube: Secondary | ICD-10-CM

## 2021-03-14 NOTE — Patient Instructions (Signed)
Return as needed

## 2023-10-13 ENCOUNTER — Encounter: Payer: Self-pay | Admitting: Oncology
